# Patient Record
Sex: Female | Born: 1980 | Race: White | Hispanic: No | Marital: Married | Smoking: Never smoker
Health system: Southern US, Community
[De-identification: ages and names within clinical notes are randomized; demographics above are authoritative.]

## PROBLEM LIST (undated history)

## (undated) DIAGNOSIS — G43109 Migraine with aura, not intractable, without status migrainosus: Secondary | ICD-10-CM

## (undated) DIAGNOSIS — J069 Acute upper respiratory infection, unspecified: Secondary | ICD-10-CM

## (undated) DIAGNOSIS — Q513 Bicornate uterus: Secondary | ICD-10-CM

## (undated) DIAGNOSIS — J309 Allergic rhinitis, unspecified: Secondary | ICD-10-CM

## (undated) DIAGNOSIS — J45909 Unspecified asthma, uncomplicated: Secondary | ICD-10-CM

## (undated) HISTORY — PX: DILATION AND CURETTAGE OF UTERUS: SHX78

## (undated) HISTORY — DX: Allergic rhinitis, unspecified: J30.9

## (undated) HISTORY — DX: Migraine with aura, not intractable, without status migrainosus: G43.109

## (undated) HISTORY — DX: Acute upper respiratory infection, unspecified: J06.9

## (undated) HISTORY — DX: Bicornate uterus: Q51.3

## (undated) HISTORY — DX: Unspecified asthma, uncomplicated: J45.909

## (undated) HISTORY — PX: REFRACTIVE SURGERY: SHX103

---

## 2017-09-14 DIAGNOSIS — L03032 Cellulitis of left toe: Secondary | ICD-10-CM | POA: Diagnosis not present

## 2017-10-07 ENCOUNTER — Ambulatory Visit (INDEPENDENT_AMBULATORY_CARE_PROVIDER_SITE_OTHER): Payer: 59 | Admitting: Allergy

## 2017-10-07 ENCOUNTER — Encounter: Payer: Self-pay | Admitting: Allergy

## 2017-10-07 VITALS — BP 110/80 | HR 64 | Temp 98.2°F | Resp 16 | Ht 64.5 in | Wt 150.4 lb

## 2017-10-07 DIAGNOSIS — H1010 Acute atopic conjunctivitis, unspecified eye: Secondary | ICD-10-CM | POA: Diagnosis not present

## 2017-10-07 DIAGNOSIS — J309 Allergic rhinitis, unspecified: Secondary | ICD-10-CM

## 2017-10-07 MED ORDER — AZELASTINE HCL 0.1 % NA SOLN: 2.0000 | Freq: Two times a day (BID) | NASAL | 5 refills | Status: DC

## 2017-10-07 MED ORDER — FLUTICASONE PROPIONATE 50 MCG/ACT NA SUSP: 2.0000 | Freq: Every day | NASAL | 3 refills | Status: DC | PRN

## 2017-10-07 NOTE — Progress Notes (Signed)
New Patient Note  RE: Branae Crail MRN: [id number] DOB: [birth year] Date of Office Visit: 10/07/2017  Referring provider: No ref. provider found Primary care provider: Marda Stalker, PA-C  Chief Complaint: allergies  History of present illness: Leslie Barnes is a 37 y.o. female presenting today for evaluation of allergic rhinitis.    She has moved from [place] to [place] area in June 2019.  While in [place] she was seeing allergist, Dr. Stephanie Coup.  She has a history of allergic rhinitis, chronic sinusitis with a deviated nasal septum.  She had skin testing in January 2019 that showed per the records "strong reactions to grasses, ragweed and dog dander with moderate reactions to trees, cats, feathers and dust mite.  She had environmental allergy testing by serum IgE done in January 2019 as well that showed sensitivity to [place] grass, cockroach, ragweed, dust mites, dog, mtn cedar, mugwort, timothy grass, cat.   She had immunoglobulin levels done that were normal (IgG 792, IgM 148, IgA 234).  She had pneumococcal titers drawn that showed very poor protection with 6 out of 23 strains protected.  She did receive the Pneumovax and have repeat levels done in May 2019 with an appropriate response with now 16 out of 23 strains protected. She denies any infectious history and states that she does not have any history of sinus infections, ear infections, pneumonias, skin infections or any opportunistic infections. With her allergy symptoms she does report sneezing, scratchy throat, sinus pressure in forehead, nasal congestion, nasal drainage with throat clearing and occasional itchy eyes.  Symptoms were worse in spring and summer when she was living in [place].  She has been symptomatic this summer since living here in the [place] area.  She takes either alavert or zyrtec as needed.   She has tried Human resources officer, [facility] as well as dymista in the past.  She states she has used visine allergy eye drops  as needed in the past.  She also has tried singulair which did not seem to make a difference in symptoms control thus this was stopped.  No food allergy history, eczema.   She reports having childhood asthma prior to middle school but has not had any problems since that time.  Review of systems: Review of Systems  Constitutional: Negative for chills, fever and malaise/fatigue.  HENT: Positive for congestion. Negative for ear discharge, ear pain, nosebleeds and sore throat.   Eyes: Negative for pain, discharge and redness.  Respiratory: Negative for cough, shortness of breath and wheezing.   Cardiovascular: Negative for chest pain.  Gastrointestinal: Negative for abdominal pain, constipation, diarrhea, heartburn, nausea and vomiting.  Musculoskeletal: Negative for joint pain.  Skin: Negative for itching and rash.  Neurological: Positive for headaches.    All other systems negative unless noted above in HPI  Past medical history: Past Medical History:  Diagnosis Date  . Allergic rhinitis   . Asthma    Childhood  . Recurrent upper respiratory infection (URI)     Past surgical history: Past Surgical History:  Procedure Laterality Date  . CESAREAN SECTION  2009 and 2012    Family history:  Family History  Problem Relation Age of Onset  . Asthma Mother   . Allergic rhinitis Mother     Social history: She lives in a home with her family without carpeting with gas heating and central cooling.  There is a dog in the home.  There is no concern for water damage, mildew or roaches in the  home.  She has no smoking history.  Medication List: Allergies as of 10/07/2017   No Known Allergies     Medication List        Accurate as of 10/07/17  1:15 PM. Always use your most recent med list.          cetirizine 10 MG tablet Commonly known as:  ZYRTEC Take 10 mg by mouth daily.   omeprazole 20 MG capsule Commonly known as:  PRILOSEC Take 20 mg by mouth daily.       Known  medication allergies: No Known Allergies   Physical examination: Blood pressure 110/80, pulse 64, temperature 98.2 F (36.8 C), resp. rate 16, height 5' 4.5" (1.638 m), weight 150 lb 6.4 oz (68.2 kg), SpO2 99 %.  General: Alert, interactive, in no acute distress. HEENT: PERRLA, TMs pearly gray, turbinates mildly edematous without discharge nasal septum is deviated to the right, post-pharynx non erythematous. Neck: Supple without lymphadenopathy. Lungs: Clear to auscultation without wheezing, rhonchi or rales. {no increased work of breathing. CV: Normal S1, S2 without murmurs. Abdomen: Nondistended, nontender. Skin: Warm and dry, without lesions or rashes. Extremities:  No clubbing, cyanosis or edema. Neuro:   Grossly intact.  Diagnositics/Labs: Labs: See HPI and scanned into EMR   Assessment and plan:   Allergic rhinoconjunctivitis    - previous testing from your allergist in [place] shows sensitivity to grass pollen, tree pollen, weed pollen, cockroach, dust mites, dog and cat.       - provided with allergen avoidance measures for these allergens    - recommend holding off on allergy testing until you have lived through full seasons here.  Thus will plan to perform skin testing around this time next year.      - take long-acting antihistamine daily as needed. Provided with samples of xyzal and allegra to determine if either of these will be more effective than Zyrtec or Alavert    - for nasal congestion recommend use of nasal steroid spray like Flonase, Rhinocort or Nasacort 2 sprays each nostril daily as needed.  Use for 1-2 weeks at a time before stopping for maximal response    - for nasal drainage/post-nasal drip recommend use of nasal antihistamine, Astelin 2 sprays each nostril twice a day as needed.        - for itchy/watery/red eyes recommend use of OTC Alaway or Zaditor  Poor pneumococcal titers     - you have been vaccinated to pneumovax by your previous allergist and had a  robust response!   No further testing is needed at this time.    Follow-up 6-9 months or sooner if needed   I appreciate the opportunity to take part in Ambrielle's care. Please do not hesitate to contact me with questions.  Sincerely,   Prudy Feeler, MD Allergy/Immunology [facility]

## 2017-10-07 NOTE — Patient Instructions (Signed)
Allergic rhinoconjunctivitis    - previous testing from your allergist in [place] shows sensitivity to grass pollen, tree pollen, weed pollen, cockroach, dust mites, dog and cat.       - provided with allergen avoidance measures for these allergens    - recommend holding off on allergy testing until you have lived through full seasons here.  Thus will plan to perform skin testing around this time next year.      - take long-acting antihistamine daily as needed. Provided with samples of xyzal and allegra to determine if either of these will be more effective than Zyrtec or Alavert    - for nasal congestion recommend use of nasal steroid spray like Flonase, Rhinocort or Nasacort 2 sprays each nostril daily as needed.  Use for 1-2 weeks at a time before stopping for maximal response    - for nasal drainage/post-nasal drip recommend use of nasal antihistamine, Astelin 2 sprays each nostril twice a day as needed.        - for itchy/watery/red eyes recommend use of OTC Alaway or Zaditor  Poor pneumococcal titers     - you have been vaccinated to pneumovax by your previous allergist and had a robust response!   No further testing is needed at this time.     Let us know which medications you have at home and which we should send in for you.    Follow-up 6-9 months or sooner if needed

## 2017-11-01 DIAGNOSIS — K219 Gastro-esophageal reflux disease without esophagitis: Secondary | ICD-10-CM | POA: Diagnosis not present

## 2017-11-04 DIAGNOSIS — M25512 Pain in left shoulder: Secondary | ICD-10-CM | POA: Insufficient documentation

## 2017-11-24 DIAGNOSIS — Z23 Encounter for immunization: Secondary | ICD-10-CM | POA: Diagnosis not present

## 2017-12-07 DIAGNOSIS — N926 Irregular menstruation, unspecified: Secondary | ICD-10-CM | POA: Diagnosis not present

## 2017-12-07 DIAGNOSIS — Z6825 Body mass index (BMI) 25.0-25.9, adult: Secondary | ICD-10-CM | POA: Diagnosis not present

## 2017-12-07 DIAGNOSIS — Z01411 Encounter for gynecological examination (general) (routine) with abnormal findings: Secondary | ICD-10-CM | POA: Diagnosis not present

## 2017-12-21 DIAGNOSIS — D225 Melanocytic nevi of trunk: Secondary | ICD-10-CM | POA: Diagnosis not present

## 2017-12-21 DIAGNOSIS — L814 Other melanin hyperpigmentation: Secondary | ICD-10-CM | POA: Diagnosis not present

## 2017-12-21 DIAGNOSIS — D1801 Hemangioma of skin and subcutaneous tissue: Secondary | ICD-10-CM | POA: Diagnosis not present

## 2018-01-10 DIAGNOSIS — K219 Gastro-esophageal reflux disease without esophagitis: Secondary | ICD-10-CM | POA: Diagnosis not present

## 2018-01-10 DIAGNOSIS — Z0000 Encounter for general adult medical examination without abnormal findings: Secondary | ICD-10-CM | POA: Diagnosis not present

## 2018-01-18 DIAGNOSIS — L309 Dermatitis, unspecified: Secondary | ICD-10-CM | POA: Diagnosis not present

## 2018-01-30 DIAGNOSIS — M7502 Adhesive capsulitis of left shoulder: Secondary | ICD-10-CM | POA: Insufficient documentation

## 2018-02-17 DIAGNOSIS — J019 Acute sinusitis, unspecified: Secondary | ICD-10-CM | POA: Diagnosis not present

## 2018-04-11 ENCOUNTER — Encounter: Payer: Self-pay | Admitting: Obstetrics and Gynecology

## 2018-04-12 DIAGNOSIS — N644 Mastodynia: Secondary | ICD-10-CM | POA: Diagnosis not present

## 2018-04-18 ENCOUNTER — Encounter: Payer: Self-pay | Admitting: Obstetrics and Gynecology

## 2018-04-18 DIAGNOSIS — R922 Inconclusive mammogram: Secondary | ICD-10-CM | POA: Diagnosis not present

## 2018-04-18 DIAGNOSIS — N644 Mastodynia: Secondary | ICD-10-CM | POA: Diagnosis not present

## 2018-04-26 ENCOUNTER — Encounter: Payer: 59 | Admitting: Obstetrics and Gynecology

## 2018-04-27 ENCOUNTER — Encounter: Payer: 59 | Admitting: Obstetrics and Gynecology

## 2018-12-12 ENCOUNTER — Other Ambulatory Visit: Payer: Self-pay

## 2018-12-13 ENCOUNTER — Encounter: Payer: Self-pay | Admitting: Obstetrics and Gynecology

## 2018-12-13 ENCOUNTER — Other Ambulatory Visit (HOSPITAL_COMMUNITY)
Admission: RE | Admit: 2018-12-13 | Discharge: 2018-12-13 | Disposition: A | Discharge status: Home / Self Care | Payer: 59 | Source: Ambulatory Visit | Attending: Obstetrics and Gynecology | Admitting: Obstetrics and Gynecology

## 2018-12-13 ENCOUNTER — Ambulatory Visit (INDEPENDENT_AMBULATORY_CARE_PROVIDER_SITE_OTHER): Payer: 59 | Admitting: Obstetrics and Gynecology

## 2018-12-13 VITALS — BP 116/80 | HR 64 | Temp 97.3°F | Ht 65.0 in | Wt 163.8 lb

## 2018-12-13 DIAGNOSIS — N926 Irregular menstruation, unspecified: Secondary | ICD-10-CM

## 2018-12-13 DIAGNOSIS — Z0000 Encounter for general adult medical examination without abnormal findings: Secondary | ICD-10-CM | POA: Diagnosis not present

## 2018-12-13 DIAGNOSIS — Z01419 Encounter for gynecological examination (general) (routine) without abnormal findings: Secondary | ICD-10-CM

## 2018-12-13 DIAGNOSIS — Z124 Encounter for screening for malignant neoplasm of cervix: Secondary | ICD-10-CM

## 2018-12-13 DIAGNOSIS — N946 Dysmenorrhea, unspecified: Secondary | ICD-10-CM

## 2018-12-13 DIAGNOSIS — Z833 Family history of diabetes mellitus: Secondary | ICD-10-CM | POA: Diagnosis not present

## 2018-12-13 MED ORDER — NAPROXEN SODIUM 550 MG PO TABS: 550.0000 mg | ORAL_TABLET | Freq: Two times a day (BID) | ORAL | 1 refills | Status: DC

## 2018-12-13 NOTE — Patient Instructions (Signed)

## 2018-12-13 NOTE — Progress Notes (Signed)
38 y.o. [id number] Married White or Caucasian Not Hispanic or Latino female here for annual exam.   Husband with vasectomy, no dyspareunia.   Cycles have been irregular since she had her daughter 8 years ago. Range from q 27-35 days. Heaviness and cramps vary.  Period Duration (Days): 3-6 days Period Pattern: (!) Irregular Menstrual Flow: Light, Heavy Menstrual Control: Tampon, Thin pad Menstrual Control Change Freq (Hours): changes tampon/pad every 2 hours on heavy days, changes tampon/pad every 4-6 hours Dysmenorrhea: (!) Severe Dysmenorrhea Symptoms: Cramping  Patient's last menstrual period was 11/10/2018 (exact date).          Sexually active: Yes.    The current method of family planning is vasectomy.    Exercising: Yes.    riding bikes, walking Smoker:  no  Health Maintenance: Pap: Unsure  History of abnormal Pap:  no MMG:  At [facility] 05/2018 WNL BMD:   N/A Colonoscopy: N/A TDaP:  UTD per patient. 10/24/2012 Gardasil: completed all 3   reports that she has never smoked. She has quit using smokeless tobacco. She reports current alcohol use. She reports that she does not use drugs. Up to 5 drinks a week. Moved here from [place]. Self employed, insurance and billing, working from home. 35 year old daughter and 51 year old son.    Past Medical History:  Diagnosis Date  . Allergic rhinitis   . Asthma    Childhood  . Bicornate uterus   . Recurrent upper respiratory infection (URI)     Past Surgical History:  Procedure Laterality Date  . CESAREAN SECTION  2009 and 2012  . DILATION AND CURETTAGE OF UTERUS    . REFRACTIVE SURGERY    Hysteroscopy in 2018 for AUB in the office. Normal.  Current Outpatient Medications  Medication Sig Dispense Refill  . azelastine (ASTELIN) 0.1 % nasal spray Place 2 sprays into both nostrils 2 (two) times daily. 30 mL 5  . cetirizine (ZYRTEC) 10 MG tablet Take 10 mg by mouth daily.    . fluticasone (FLONASE) 50 MCG/ACT nasal spray Place 2 sprays  into both nostrils daily as needed for allergies or rhinitis. 16 g 3  . Multiple Vitamin (MULTIVITAMIN PO) Take by mouth.    Marland Kitchen omeprazole (PRILOSEC) 20 MG capsule Take 20 mg by mouth daily.     No current facility-administered medications for this visit.     Family History  Problem Relation Age of Onset  . Asthma Mother   . Allergic rhinitis Mother   . Non-Hodgkin's lymphoma Mother   . Diabetes Father   . Heart failure Maternal Grandmother   . COPD Maternal Grandmother   . Hypertension Maternal Grandmother     Review of Systems  Constitutional: Negative.   HENT: Negative.   Eyes: Negative.   Respiratory: Negative.   Cardiovascular: Negative.   Gastrointestinal: Negative.   Endocrine: Negative.   Genitourinary: Positive for menstrual problem.  Musculoskeletal: Negative.   Skin: Negative.   Allergic/Immunologic: Negative.   Neurological: Negative.   Hematological: Negative.   Psychiatric/Behavioral: Negative.     Exam:   BP 116/80 (BP Location: Right Arm, Patient Position: Sitting, Cuff Size: Normal)   Pulse 64   Temp (!) 97.3 F (36.3 C) (Skin)   Ht 5\' 5"  (1.651 m)   Wt 163 lb 12.8 oz (74.3 kg)   LMP 11/10/2018 (Exact Date)   BMI 27.26 kg/m   Weight change: @WEIGHTCHANGE @ Height:   Height: 5\' 5"  (165.1 cm)  Ht Readings from Last 3 Encounters:  12/13/18 5\' 5"  (1.651 m)  10/07/17 5' 4.5" (1.638 m)    General appearance: alert, cooperative and appears stated age Head: Normocephalic, without obvious abnormality, atraumatic Neck: no adenopathy, supple, symmetrical, trachea midline and thyroid normal to inspection and palpation Lungs: clear to auscultation bilaterally Cardiovascular: regular rate and rhythm Breasts: normal appearance, no masses or tenderness Abdomen: soft, non-tender; non distended,  no masses,  no organomegaly Extremities: extremities normal, atraumatic, no cyanosis or edema Skin: Skin color, texture, turgor normal. No rashes or lesions Lymph  nodes: Cervical, supraclavicular, and axillary nodes normal. No abnormal inguinal nodes palpated Neurologic: Grossly normal   Pelvic: External genitalia:  no lesions              Urethra:  normal appearing urethra with no masses, tenderness or lesions              Bartholins and Skenes: normal                 Vagina: normal appearing vagina with normal color and discharge, no lesions              Cervix: no lesions               Bimanual Exam:  Uterus:  normal size, contour, position, consistency, mobility, non-tender and retroverted              Adnexa: no mass, fullness, tenderness               Rectovaginal: Confirms               Anus:  normal sphincter tone, no lesions  Chaperone was present for exam.  A:  Well Woman with normal exam  FH of DM  Irregular cycles  Dysmenorrhea  P:   Pap with hpv  Screening labs, HgbA1C, TSH  Discussed breast self exam  Discussed calcium and vit D intake  Mammogram due next year  Anaprox for cramps

## 2018-12-14 LAB — TSH: TSH: 2.11 u[IU]/mL (ref 0.450–4.500)

## 2018-12-14 LAB — COMPREHENSIVE METABOLIC PANEL
ALT: 19 IU/L (ref 0–32)
AST: 18 IU/L (ref 0–40)
Albumin/Globulin Ratio: 1.8 (ref 1.2–2.2)
Albumin: 4.5 g/dL (ref 3.8–4.8)
Alkaline Phosphatase: 53 IU/L (ref 39–117)
BUN/Creatinine Ratio: 19 (ref 9–23)
BUN: 15 mg/dL (ref 6–20)
Bilirubin Total: 0.6 mg/dL (ref 0.0–1.2)
CO2: 23 mmol/L (ref 20–29)
Calcium: 9.8 mg/dL (ref 8.7–10.2)
Chloride: 100 mmol/L (ref 96–106)
Creatinine, Ser: 0.79 mg/dL (ref 0.57–1.00)
GFR calc Af Amer: 109 mL/min/{1.73_m2} (ref >59)
GFR calc non Af Amer: 95 mL/min/{1.73_m2} (ref >59)
Globulin, Total: 2.5 g/dL (ref 1.5–4.5)
Glucose: 93 mg/dL (ref 65–99)
Potassium: 4.2 mmol/L (ref 3.5–5.2)
Sodium: 139 mmol/L (ref 134–144)
Total Protein: 7 g/dL (ref 6.0–8.5)

## 2018-12-14 LAB — LIPID PANEL
Chol/HDL Ratio: 3 ratio (ref 0.0–4.4)
Cholesterol, Total: 170 mg/dL (ref 100–199)
HDL: 57 mg/dL (ref >39)
LDL Chol Calc (NIH): 93 mg/dL (ref 0–99)
Triglycerides: 110 mg/dL (ref 0–149)
VLDL Cholesterol Cal: 20 mg/dL (ref 5–40)

## 2018-12-14 LAB — HEMOGLOBIN A1C
Est. average glucose Bld gHb Est-mCnc: 108 mg/dL
Hgb A1c MFr Bld: 5.4 % (ref 4.8–5.6)

## 2018-12-14 LAB — CBC
Hematocrit: 41.8 % (ref 34.0–46.6)
Hemoglobin: 14 g/dL (ref 11.1–15.9)
MCH: 29.3 pg (ref 26.6–33.0)
MCHC: 33.5 g/dL (ref 31.5–35.7)
MCV: 87 fL (ref 79–97)
Platelets: 184 10*3/uL (ref 150–450)
RBC: 4.78 x10E6/uL (ref 3.77–5.28)
RDW: 12.3 % (ref 11.7–15.4)
WBC: 5.9 10*3/uL (ref 3.4–10.8)

## 2018-12-20 LAB — CYTOLOGY - PAP
Comment: NEGATIVE
Diagnosis: NEGATIVE
High risk HPV: NEGATIVE

## 2018-12-28 ENCOUNTER — Encounter: Payer: Self-pay | Admitting: Obstetrics and Gynecology

## 2018-12-28 ENCOUNTER — Telehealth: Payer: Self-pay | Admitting: Obstetrics and Gynecology

## 2018-12-28 MED ORDER — MEDROXYPROGESTERONE ACETATE 5 MG PO TABS: 5.0000 mg | ORAL_TABLET | Freq: Every day | ORAL | 0 refills | Status: DC

## 2018-12-28 NOTE — Telephone Encounter (Signed)
You can call in provera 5 mg x 5 days, if her cycles get further out we should have her come in for a prolactin level. She just had a normal TSH.

## 2018-12-28 NOTE — Telephone Encounter (Signed)
Spoke with patient, advised per Dr. Talbert Nan. Rx for Provera to verified pharmacy. Patient verbalizes understanding and is agreeable.   Encounter closed.

## 2018-12-28 NOTE — Telephone Encounter (Signed)
Patient sent the following message through [place]. Routing to triage to assist patient with request.  Saiya, [facility]  Phone Number: [phone]        I haven't started my cycle, I feel like I am going to start and I am very uncomfortable. Is there anything you can prescribe to help this happen?  Thank you  Leslie Barnes

## 2018-12-28 NOTE — Telephone Encounter (Signed)
Spoke with patient. Patient was seen in office on 12/13/18 for AEX. LMP 11/10/18. Hx of irregular cycles.  Vasectomy for contraceptive. Menses has not started to date. Reports intermittent menses cramps and bloating like menses is going to start and never does. Denies N/V, fever/chills, vaginal odor, d/c, or pain today. Normal BMs.   Patient is requesting Rx to help menses start. Advised patient I will have to review with Dr. Talbert Nan and return call, patient agreeable. Pharmacy confirmed.   Dr. Talbert Nan -please advise.

## 2019-01-30 ENCOUNTER — Encounter: Payer: Self-pay | Admitting: Obstetrics and Gynecology

## 2019-02-23 ENCOUNTER — Other Ambulatory Visit: Payer: 59

## 2019-12-17 NOTE — Progress Notes (Signed)
39 y.o. [id number] Married White or Caucasian Not Hispanic or Latino female here for annual exam.   Cycles every 27-35 days.  Period Duration (Days): 5-7 Period Pattern: (!) Irregular Menstrual Flow: Heavy, Moderate, Light Menstrual Control: Maxi pad, Tampon Menstrual Control Change Freq (Hours): 2-3 Dysmenorrhea: (!) Moderate Dysmenorrhea Symptoms: Cramping  No dyspareunia.   Patient's last menstrual period was 11/23/2019.          Sexually active: Yes.    The current method of family planning is vasectomy  Exercising: Yes.    walking, and running, yoga Smoker:  no  Health Maintenance: Pap:12/13/18 WNL Hr HPV Neg  11/11/16 WNL  History of abnormal Pap:  no MMG:  U/s  04/18/2018  Bi-rads normal [facility].  BMD:   None  Colonoscopy: none  TDaP:  utd per patient 10/24/2012 Gardasil: complete    reports that she has never smoked. She has quit using smokeless tobacco. She reports current alcohol use. She reports that she does not use drugs. 4 drinks a week. 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    Past Medical History:  Diagnosis Date  . Allergic rhinitis   . Asthma    Childhood  . Bicornate uterus   . Recurrent upper respiratory infection (URI)     Past Surgical History:  Procedure Laterality Date  . CESAREAN SECTION  2009 and 2012  . DILATION AND CURETTAGE OF UTERUS    . REFRACTIVE SURGERY      Current Outpatient Medications  Medication Sig Dispense Refill  . cetirizine (ZYRTEC) 10 MG tablet Take 10 mg by mouth daily.    . Multiple Vitamin (MULTIVITAMIN PO) Take by mouth.    Marland Kitchen omeprazole (PRILOSEC) 20 MG capsule Take 20 mg by mouth daily.     No current facility-administered medications for this visit.    Family History  Problem Relation Age of Onset  . Asthma Mother   . Allergic rhinitis Mother   . Non-Hodgkin's lymphoma Mother   . Diabetes Father   . Heart failure Maternal Grandmother   . COPD Maternal  Grandmother   . Hypertension Maternal Grandmother     Review of Systems  Allergic/Immunologic: Positive for environmental allergies.  All other systems reviewed and are negative.   Exam:   BP 110/68   Pulse 98   Ht 5' 4.5" (1.638 m)   Wt 164 lb (74.4 kg)   LMP 11/23/2019   SpO2 100%   BMI 27.72 kg/m   Weight change: @WEIGHTCHANGE @ Height:   Height: 5' 4.5" (163.8 cm)  Ht Readings from Last 3 Encounters:  12/19/19 5' 4.5" (1.638 m)  12/13/18 5\' 5"  (1.651 m)  10/07/17 5' 4.5" (1.638 m)    General appearance: alert, cooperative and appears stated age Head: Normocephalic, without obvious abnormality, atraumatic Neck: no adenopathy, supple, symmetrical, trachea midline and thyroid normal to inspection and palpation Lungs: clear to auscultation bilaterally Cardiovascular: regular rate and rhythm Breasts: normal appearance, no masses or tenderness Abdomen: soft, non-tender; non distended,  no masses,  no organomegaly Extremities: extremities normal, atraumatic, no cyanosis or edema Skin: Skin color, texture, turgor normal. No rashes or lesions Lymph nodes: Cervical, supraclavicular, and axillary nodes normal. No abnormal inguinal nodes palpated Neurologic: Grossly normal   Pelvic: External genitalia:  no lesions              Urethra:  normal appearing urethra with no masses, tenderness or lesions  Bartholins and Skenes: normal                 Vagina: normal appearing vagina with normal color and discharge, no lesions              Cervix: no lesions               Bimanual Exam:  Uterus:  normal size, contour, position, consistency, mobility, non-tender and retroverted              Adnexa: no mass, fullness, tenderness               Rectovaginal: declines  Shanon Petty chaperoned for the exam.  A:  Well Woman with normal exam  FH DM  P:   No pap this year  Screening labs, HgbA1C  She will schedule her mammogram  Discussed breast self exam  Discussed calcium  and vit D intake

## 2019-12-19 ENCOUNTER — Other Ambulatory Visit: Payer: Self-pay

## 2019-12-19 ENCOUNTER — Ambulatory Visit (INDEPENDENT_AMBULATORY_CARE_PROVIDER_SITE_OTHER): Payer: BC Managed Care – PPO | Admitting: Obstetrics and Gynecology

## 2019-12-19 ENCOUNTER — Encounter: Payer: Self-pay | Admitting: Obstetrics and Gynecology

## 2019-12-19 VITALS — BP 110/68 | HR 98 | Ht 64.5 in | Wt 164.0 lb

## 2019-12-19 DIAGNOSIS — Z0000 Encounter for general adult medical examination without abnormal findings: Secondary | ICD-10-CM | POA: Diagnosis not present

## 2019-12-19 DIAGNOSIS — Z833 Family history of diabetes mellitus: Secondary | ICD-10-CM | POA: Diagnosis not present

## 2019-12-19 DIAGNOSIS — Z01419 Encounter for gynecological examination (general) (routine) without abnormal findings: Secondary | ICD-10-CM | POA: Diagnosis not present

## 2019-12-19 NOTE — Patient Instructions (Signed)

## 2019-12-20 LAB — COMPREHENSIVE METABOLIC PANEL
ALT: 15 IU/L (ref 0–32)
AST: 16 IU/L (ref 0–40)
Albumin/Globulin Ratio: 1.6 (ref 1.2–2.2)
Albumin: 4.1 g/dL (ref 3.8–4.8)
Alkaline Phosphatase: 48 IU/L (ref 44–121)
BUN/Creatinine Ratio: 15 (ref 9–23)
BUN: 13 mg/dL (ref 6–24)
Bilirubin Total: 0.5 mg/dL (ref 0.0–1.2)
CO2: 23 mmol/L (ref 20–29)
Calcium: 8.8 mg/dL (ref 8.7–10.2)
Chloride: 103 mmol/L (ref 96–106)
Creatinine, Ser: 0.85 mg/dL (ref 0.57–1.00)
GFR calc Af Amer: 99 mL/min/{1.73_m2} (ref >59)
GFR calc non Af Amer: 86 mL/min/{1.73_m2} (ref >59)
Globulin, Total: 2.5 g/dL (ref 1.5–4.5)
Glucose: 85 mg/dL (ref 65–99)
Potassium: 4.1 mmol/L (ref 3.5–5.2)
Sodium: 140 mmol/L (ref 134–144)
Total Protein: 6.6 g/dL (ref 6.0–8.5)

## 2019-12-20 LAB — HEMOGLOBIN A1C
Est. average glucose Bld gHb Est-mCnc: 114 mg/dL
Hgb A1c MFr Bld: 5.6 % (ref 4.8–5.6)

## 2019-12-20 LAB — CBC
Hematocrit: 41.1 % (ref 34.0–46.6)
Hemoglobin: 13.2 g/dL (ref 11.1–15.9)
MCH: 28.8 pg (ref 26.6–33.0)
MCHC: 32.1 g/dL (ref 31.5–35.7)
MCV: 90 fL (ref 79–97)
Platelets: 171 10*3/uL (ref 150–450)
RBC: 4.58 x10E6/uL (ref 3.77–5.28)
RDW: 12.7 % (ref 11.7–15.4)
WBC: 8.1 10*3/uL (ref 3.4–10.8)

## 2019-12-20 LAB — LIPID PANEL
Chol/HDL Ratio: 3.2 ratio (ref 0.0–4.4)
Cholesterol, Total: 163 mg/dL (ref 100–199)
HDL: 51 mg/dL (ref >39)
LDL Chol Calc (NIH): 86 mg/dL (ref 0–99)
Triglycerides: 149 mg/dL (ref 0–149)
VLDL Cholesterol Cal: 26 mg/dL (ref 5–40)

## 2019-12-24 DIAGNOSIS — D1801 Hemangioma of skin and subcutaneous tissue: Secondary | ICD-10-CM | POA: Diagnosis not present

## 2019-12-24 DIAGNOSIS — D225 Melanocytic nevi of trunk: Secondary | ICD-10-CM | POA: Diagnosis not present

## 2019-12-24 DIAGNOSIS — L814 Other melanin hyperpigmentation: Secondary | ICD-10-CM | POA: Diagnosis not present

## 2020-01-16 ENCOUNTER — Encounter: Payer: Self-pay | Admitting: Obstetrics and Gynecology

## 2020-01-16 DIAGNOSIS — Z1231 Encounter for screening mammogram for malignant neoplasm of breast: Secondary | ICD-10-CM | POA: Diagnosis not present

## 2020-02-18 DIAGNOSIS — J0100 Acute maxillary sinusitis, unspecified: Secondary | ICD-10-CM | POA: Diagnosis not present

## 2020-02-19 ENCOUNTER — Encounter: Payer: Self-pay | Admitting: Obstetrics and Gynecology

## 2020-05-15 ENCOUNTER — Other Ambulatory Visit: Payer: Self-pay | Admitting: Obstetrics and Gynecology

## 2020-05-15 DIAGNOSIS — Z1231 Encounter for screening mammogram for malignant neoplasm of breast: Secondary | ICD-10-CM

## 2020-05-23 ENCOUNTER — Ambulatory Visit
Admission: RE | Admit: 2020-05-23 | Discharge: 2020-05-23 | Disposition: A | Discharge status: Home / Self Care | Payer: BC Managed Care – PPO | Source: Ambulatory Visit | Attending: Obstetrics and Gynecology | Admitting: Obstetrics and Gynecology

## 2020-05-23 ENCOUNTER — Other Ambulatory Visit: Payer: Self-pay

## 2020-05-23 DIAGNOSIS — Z1231 Encounter for screening mammogram for malignant neoplasm of breast: Secondary | ICD-10-CM

## 2020-05-24 ENCOUNTER — Inpatient Hospital Stay: Admission: RE | Admit: 2020-05-24 | Payer: Self-pay | Source: Ambulatory Visit

## 2020-05-24 DIAGNOSIS — Z1231 Encounter for screening mammogram for malignant neoplasm of breast: Secondary | ICD-10-CM

## 2020-06-25 DIAGNOSIS — Z0000 Encounter for general adult medical examination without abnormal findings: Secondary | ICD-10-CM | POA: Diagnosis not present

## 2020-07-18 ENCOUNTER — Telehealth: Payer: Self-pay | Admitting: *Deleted

## 2020-07-18 ENCOUNTER — Encounter: Payer: Self-pay | Admitting: Obstetrics and Gynecology

## 2020-07-18 NOTE — Telephone Encounter (Signed)
-----   Message from Leamon Arnt, [place] sent at 07/18/2020  2:39 PM EDT ----- I called this patient to schedule. Patient declined an actual office visit stating "I have discussed this with Dr. Talbert Nan before and have had my labs done already".  I offered several dates and times, patient still declined and stated "Just forget about it I will go to my PCP". Patient has not been seen with Dr.Jertson or the office since Nov 2021

## 2020-07-18 NOTE — Telephone Encounter (Signed)
Leslie Barnes called to schedule annual exam, the below is placed in chart for documentation.

## 2020-10-01 ENCOUNTER — Encounter: Payer: Self-pay | Admitting: Obstetrics and Gynecology

## 2020-10-01 ENCOUNTER — Ambulatory Visit (INDEPENDENT_AMBULATORY_CARE_PROVIDER_SITE_OTHER): Payer: BC Managed Care – PPO | Admitting: Obstetrics and Gynecology

## 2020-10-01 ENCOUNTER — Other Ambulatory Visit: Payer: Self-pay

## 2020-10-01 VITALS — BP 122/74 | HR 83 | Ht 64.0 in | Wt 170.0 lb

## 2020-10-01 DIAGNOSIS — Z872 Personal history of diseases of the skin and subcutaneous tissue: Secondary | ICD-10-CM | POA: Diagnosis not present

## 2020-10-01 DIAGNOSIS — Z0000 Encounter for general adult medical examination without abnormal findings: Secondary | ICD-10-CM

## 2020-10-01 NOTE — Progress Notes (Signed)
GYNECOLOGY  VISIT   HPI: 40 y.o.   Married White or Caucasian Not Hispanic or Latino  female   [phone] with Patient's last menstrual period was 09/21/2020.   here for an itchy rash on the side of  right breast and right axilla.  A couple of weeks ago she was using her deodorant and had some erythema in her axilla and lateral right breast. Since she stopped using her deodorant she is better. She was sweating a lot. She tried the deodorant again and got red again. She stopped using it and is better again. Last mammogram was in 4/22 and was normal.   GYNECOLOGIC HISTORY: Patient's last menstrual period was 09/21/2020. 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     Multiple Vitamin (MULTIVITAMIN PO) Take by mouth.     omeprazole (PRILOSEC) 20 MG capsule Take 20 mg by mouth daily.     No current facility-administered medications for this visit.     ALLERGIES: Latex  Family History  Problem Relation Age of Onset   Asthma Mother    Allergic rhinitis Mother    Non-Hodgkin's lymphoma Mother    Diabetes Father    Heart failure Maternal Grandmother    COPD Maternal Grandmother    Hypertension Maternal Grandmother      Social History   Socioeconomic History   Marital status: Married    Spouse name: Not on file   Number of children: Not on file   Years of education: Not on file   Highest education level: Not on file  Occupational History   Not on file  Tobacco Use   Smoking status: Never   Smokeless tobacco: Former  Scientific laboratory technician Use: Never used  Substance and Sexual Activity   Alcohol use: Yes    Comment: socially   Drug use: Never   Sexual activity: Yes    Comment: spouse with vasectomy  Other Topics Concern   Not on file  Social History Narrative   Not on file   Social Determinants of Health   Financial Resource Strain: Not on file  Food Insecurity: Not on file  Transportation Needs: Not on file  Physical Activity: Not on file  Stress: Not on file  Social Connections: Not on file  Intimate Partner Violence: Not on file    Review of Systems  All  other systems reviewed and are negative.  PHYSICAL EXAMINATION:    BP 122/74   Pulse 83   Ht '5\' 4"'$  (1.626 m)   Wt 170 lb (77.1 kg)   LMP 09/21/2020   SpO2 100%   BMI 29.18 kg/m     General appearance: alert, cooperative and appears stated age Breasts: normal appearance, no masses or tenderness Axilla: no adenopathy  1. Normal breast exam   2. History of dermatitis She will change to a hypoallergenic deodorant.

## 2020-12-23 DIAGNOSIS — L814 Other melanin hyperpigmentation: Secondary | ICD-10-CM | POA: Diagnosis not present

## 2020-12-23 DIAGNOSIS — D225 Melanocytic nevi of trunk: Secondary | ICD-10-CM | POA: Diagnosis not present

## 2020-12-23 DIAGNOSIS — L249 Irritant contact dermatitis, unspecified cause: Secondary | ICD-10-CM | POA: Diagnosis not present

## 2020-12-24 NOTE — Progress Notes (Signed)
40 y.o. [id number] Married White or Caucasian Not Hispanic or Latino female here for annual exam.  Cycles range from 28-35 days typically. She has noticed mood changes or increased anxiety for few days to a week prior to her cycle. Gets better with her cycle. She doesn't want to go on medication.  Period Duration (Days): 4-5 Period Pattern: (!) Irregular Menstrual Flow: Heavy Menstrual Control: Maxi pad Menstrual Control Change Freq (Hours): 1 Dysmenorrhea: (!) Moderate Dysmenorrhea Symptoms: Cramping This cycle she was bleeding heavier than normal, used a thin pad hourly, feels she could have worn a regular tampon for 2 hours. Typically would saturate a regular tampon in 4 hours.  Sexually active, no pain.   Patient's last menstrual period was 12/19/2020.          Sexually active: Yes.    The current method of family planning is vasectomy.    Exercising: Yes.     Walking  Smoker:  no  Health Maintenance: Pap:  12/13/18-WNL, HPV- neg, 11/11/16- WNL History of abnormal Pap:  no MMG:  05/23/20- birads 1 neg  BMD:   n/a Colonoscopy: n/a TDaP:  10/24/2012 Gardasil: complete   reports that she has never smoked. She has quit using smokeless tobacco. She reports current alcohol use. She reports that she does not use drugs. 3 drinks a week at most. Self employed, insurance and billing, working from home. 78 year old daughter and 42 year old son.    Past Medical History:  Diagnosis Date   Allergic rhinitis    Asthma    Childhood   Bicornate uterus    Recurrent upper respiratory infection (URI)     Past Surgical History:  Procedure Laterality Date   CESAREAN SECTION  2009 and 2012   DILATION AND CURETTAGE OF UTERUS     REFRACTIVE SURGERY      Current Outpatient Medications  Medication Sig Dispense Refill   cetirizine (ZYRTEC) 10 MG tablet Take 10 mg by mouth daily.     Multiple Vitamin (MULTIVITAMIN PO) Take by mouth.     omeprazole (PRILOSEC) 20 MG capsule Take 20 mg by mouth daily.      No current facility-administered medications for this visit.    Family History  Problem Relation Age of Onset   Asthma Mother    Allergic rhinitis Mother    Non-Hodgkin's lymphoma Mother    Diabetes Father    Heart failure Maternal Grandmother    COPD Maternal Grandmother    Hypertension Maternal Grandmother   Mom in remission, doing great.   Review of Systems  All other systems reviewed and are negative.  Exam:   BP 120/72   Pulse 78   Ht 5\' 4"  (1.626 m)   Wt 172 lb (78 kg)   LMP 12/19/2020   SpO2 100%   BMI 29.52 kg/m   Weight change: @WEIGHTCHANGE @ Height:   Height: 5\' 4"  (162.6 cm)  Ht Readings from Last 3 Encounters:  12/31/20 5\' 4"  (1.626 m)  10/01/20 5\' 4"  (1.626 m)  12/19/19 5' 4.5" (1.638 m)    General appearance: alert, cooperative and appears stated age Head: Normocephalic, without obvious abnormality, atraumatic Neck: no adenopathy, supple, symmetrical, trachea midline and thyroid normal to inspection and palpation Lungs: clear to auscultation bilaterally Cardiovascular: regular rate and rhythm Breasts: normal appearance, no masses or tenderness Abdomen: soft, non-tender; non distended,  no masses,  no organomegaly Extremities: extremities normal, atraumatic, no cyanosis or edema Skin: Skin color, texture, turgor normal. No rashes or lesions  Lymph nodes: Cervical, supraclavicular, and axillary nodes normal. No abnormal inguinal nodes palpated Neurologic: Grossly normal   Pelvic: External genitalia:  no lesions              Urethra:  normal appearing urethra with no masses, tenderness or lesions              Bartholins and Skenes: normal                 Vagina: normal appearing vagina with normal color and discharge, no lesions              Cervix: no lesions               Bimanual Exam:  Uterus:  normal size, contour, position, consistency, mobility, non-tender and retroverted              Adnexa: no mass, fullness, tenderness                Rectovaginal: declines  Gae Dry chaperoned for the exam.  1. Well woman exam Discussed breast self exam Discussed calcium and vit D intake Mammogram UTD No pap this year  2. Laboratory exam ordered as part of routine general medical examination - CBC - Comprehensive metabolic panel - Lipid panel  3. Family history of diabetes mellitus - Hemoglobin A1c  4. PMS (premenstrual syndrome) Discussed eating healthy, exercise. Discussed the option SSRI's, she can't take OCP's. She will let me know if she wants to try SSRI's, reviewed the option of taking it with symptoms vs daily

## 2020-12-31 ENCOUNTER — Encounter: Payer: Self-pay | Admitting: Obstetrics and Gynecology

## 2020-12-31 ENCOUNTER — Other Ambulatory Visit: Payer: Self-pay

## 2020-12-31 ENCOUNTER — Ambulatory Visit (INDEPENDENT_AMBULATORY_CARE_PROVIDER_SITE_OTHER): Payer: BC Managed Care – PPO | Admitting: Obstetrics and Gynecology

## 2020-12-31 VITALS — BP 120/72 | HR 78 | Ht 64.0 in | Wt 172.0 lb

## 2020-12-31 DIAGNOSIS — G43109 Migraine with aura, not intractable, without status migrainosus: Secondary | ICD-10-CM

## 2020-12-31 DIAGNOSIS — Z833 Family history of diabetes mellitus: Secondary | ICD-10-CM

## 2020-12-31 DIAGNOSIS — Z0000 Encounter for general adult medical examination without abnormal findings: Secondary | ICD-10-CM | POA: Diagnosis not present

## 2020-12-31 DIAGNOSIS — N943 Premenstrual tension syndrome: Secondary | ICD-10-CM | POA: Diagnosis not present

## 2020-12-31 DIAGNOSIS — Z01419 Encounter for gynecological examination (general) (routine) without abnormal findings: Secondary | ICD-10-CM | POA: Diagnosis not present

## 2020-12-31 NOTE — Patient Instructions (Addendum)
EXERCISE   We recommended that you start or continue a regular exercise program for good health. Physical activity is anything that gets your body moving, some is better than none. The CDC recommends 150 minutes per week of Moderate-Intensity Aerobic Activity and 2 or more days of Muscle Strengthening Activity.  Benefits of exercise are limitless: helps weight loss/weight maintenance, improves mood and energy, helps with depression and anxiety, improves sleep, tones and strengthens muscles, improves balance, improves bone density, protects from chronic conditions such as heart disease, high blood pressure and diabetes and so much more. To learn more visit: https://www.cdc.gov/physicalactivity/index.html  DIET: Good nutrition starts with a healthy diet of fruits, vegetables, whole grains, and lean protein sources. Drink plenty of water for hydration. Minimize empty calories, sodium, sweets. For more information about dietary recommendations visit: https://health.gov/our-work/nutrition-physical-activity/dietary-guidelines and https://www.myplate.gov/  ALCOHOL:  Women should limit their alcohol intake to no more than 7 drinks/beers/glasses of wine (combined, not each!) per week. Moderation of alcohol intake to this level decreases your risk of breast cancer and liver damage.  If you are concerned that you may have a problem, or your friends have told you they are concerned about your drinking, there are many resources to help. A well-known program that is free, effective, and available to all people all over the nation is Alcoholics Anonymous.  Check out this site to learn more: https://www.aa.org/   CALCIUM AND VITAMIN D:  Adequate intake of calcium and Vitamin D are recommended for bone health.  You should be getting between 1000-1200 mg of calcium and 800 units of Vitamin D daily between diet and supplements  PAP SMEARS:  Pap smears, to check for cervical cancer or precancers,  have traditionally been  done yearly, scientific advances have shown that most women can have pap smears less often.  However, every woman still should have a physical exam from her gynecologist every year. It will include a breast check, inspection of the vulva and vagina to check for abnormal growths or skin changes, a visual exam of the cervix, and then an exam to evaluate the size and shape of the uterus and ovaries. We will also provide age appropriate advice regarding health maintenance, like when you should have certain vaccines, screening for sexually transmitted diseases, bone density testing, colonoscopy, mammograms, etc.   MAMMOGRAMS:  All women over 40 years old should have a routine mammogram.   COLON CANCER SCREENING: Now recommend starting at age 45. At this time colonoscopy is not covered for routine screening until 50. There are take home tests that can be done between 45-49.   COLONOSCOPY:  Colonoscopy to screen for colon cancer is recommended for all women at age 50.  We know, you hate the idea of the prep.  We agree, BUT, having colon cancer and not knowing it is worse!!  Colon cancer so often starts as a polyp that can be seen and removed at colonscopy, which can quite literally save your life!  And if your first colonoscopy is normal and you have no family history of colon cancer, most women don't have to have it again for 10 years.  Once every ten years, you can do something that may end up saving your life, right?  We will be happy to help you get it scheduled when you are ready.  Be sure to check your insurance coverage so you understand how much it will cost.  It may be covered as a preventative service at no cost, but you should check   your particular policy.      Breast Self-Awareness Breast self-awareness means being familiar with how your breasts look and feel. It involves checking your breasts regularly and reporting any changes to your health care provider. Practicing breast self-awareness is  important. A change in your breasts can be a sign of a serious medical problem. Being familiar with how your breasts look and feel allows you to find any problems early, when treatment is more likely to be successful. All women should practice breast self-awareness, including women who have had breast implants. How to do a breast self-exam One way to learn what is normal for your breasts and whether your breasts are changing is to do a breast self-exam. To do a breast self-exam: Look for Changes  Remove all the clothing above your waist. Stand in front of a mirror in a room with good lighting. Put your hands on your hips. Push your hands firmly downward. Compare your breasts in the mirror. Look for differences between them (asymmetry), such as: Differences in shape. Differences in size. Puckers, dips, and bumps in one breast and not the other. Look at each breast for changes in your skin, such as: Redness. Scaly areas. Look for changes in your nipples, such as: Discharge. Bleeding. Dimpling. Redness. A change in position. Feel for Changes Carefully feel your breasts for lumps and changes. It is best to do this while lying on your back on the floor and again while sitting or standing in the shower or tub with soapy water on your skin. Feel each breast in the following way: Place the arm on the side of the breast you are examining above your head. Feel your breast with the other hand. Start in the nipple area and make  inch (2 cm) overlapping circles to feel your breast. Use the pads of your three middle fingers to do this. Apply light pressure, then medium pressure, then firm pressure. The light pressure will allow you to feel the tissue closest to the skin. The medium pressure will allow you to feel the tissue that is a little deeper. The firm pressure will allow you to feel the tissue close to the ribs. Continue the overlapping circles, moving downward over the breast until you feel your  ribs below your breast. Move one finger-width toward the center of the body. Continue to use the  inch (2 cm) overlapping circles to feel your breast as you move slowly up toward your collarbone. Continue the up and down exam using all three pressures until you reach your armpit.  Write Down What You Find  Write down what is normal for each breast and any changes that you find. Keep a written record with breast changes or normal findings for each breast. By writing this information down, you do not need to depend only on memory for size, tenderness, or location. Write down where you are in your menstrual cycle, if you are still menstruating. If you are having trouble noticing differences in your breasts, do not get discouraged. With time you will become more familiar with the variations in your breasts and more comfortable with the exam. How often should I examine my breasts? Examine your breasts every month. If you are breastfeeding, the best time to examine your breasts is after a feeding or after using a breast pump. If you menstruate, the best time to examine your breasts is 5-7 days after your period is over. During your period, your breasts are lumpier, and it may be more   difficult to notice changes. When should I see my health care provider? See your health care provider if you notice: A change in shape or size of your breasts or nipples. A change in the skin of your breast or nipples, such as a reddened or scaly area. Unusual discharge from your nipples. A lump or thick area that was not there before. Pain in your breasts. Anything that concerns you. Premenstrual Syndrome Premenstrual syndrome (PMS) is a group of physical, emotional, and behavioral symptoms that affect women as part of their menstrual cycle. PMS occurs 1-2 weeks before the start of a woman's menstrual period and goes away a few days after menstrual bleeding begins. PMS can range from mild to severe. What are the  causes? The exact cause of this condition is not known, but it seems to be related to hormone changes that happen before menstruation. What are the signs or symptoms? Symptoms of this condition often happen every month. They go away after your period starts. Physical symptoms of this condition include: Bloating. Breast pain or tenderness. Headaches. Extreme fatigue. Backaches. Swelling of the hands and feet. Weight gain. Hot flashes. Emotional symptoms of this condition include: Mood swings. Depression. Angry or hostile outbursts. Irritability. Anxiety. Crying spells. Behavioral symptoms include: Food cravings or appetite changes. Changes in sexual desire. Confusion. Social withdrawal. Poor concentration. How is this diagnosed? This condition may be diagnosed based on a history of your symptoms. This condition is generally diagnosed if symptoms of PMS: Are present in the 5 days before your period starts. End within 4 days after your period starts. Happen at least 3 months in a row. Interfere with some of your normal activities. Other conditions that can cause some of these symptoms must be ruled out before PMS can be diagnosed. These include depression, anxiety, anemia, and thyroid problems. How is this treated? This condition may be treated by doing the following: Maintaining a healthy lifestyle. This includes eating a well-balanced diet and exercising regularly. Taking over-the-counter medicines that can help relieve symptoms, such as cramps, aches, pain, headaches, and breast tenderness. Follow these instructions at home: Eating and drinking  Eat a well-balanced diet. Avoid caffeine and alcohol. Limit the amount of salt and salty foods you eat. This will help reduce bloating. Drink enough fluid to keep your urine pale yellow. Take a multivitamin if told to do so by your health care provider. Lifestyle  Do not use any products that contain nicotine or tobacco. These  products include cigarettes, chewing tobacco, and vaping devices, such as e-cigarettes. If you need help quitting, ask your health care provider. Exercise regularly as suggested by your health care provider. Get enough sleep. For most adults, this is 7-8 hours of sleep each night. Practice relaxation techniques, such as yoga, tai chi, or meditation. Find healthy ways to manage stress. General instructions  For 2-3 months, write down your symptoms, whether they are mild to severe, and how long they last. This will help your health care provider choose the best treatment for you. Take over-the-counter and prescription medicines only as told by your health care provider. If you are using birth control pills (oral contraceptives), use them as told by your health care provider. Contact a health care provider if: Your symptoms get worse. You develop new symptoms. You have trouble doing your daily activities. Summary Premenstrual syndrome (PMS) is a group of physical, emotional, and behavioral symptoms that affect women as part of their menstrual cycle. PMS starts 1-2 weeks before the start  of a woman's period and goes away a few days after the period starts. PMS is treated by maintaining a healthy lifestyle and taking medicines to relieve the symptoms. This information is not intended to replace advice given to you by your health care provider. Make sure you discuss any questions you have with your health care provider. Document Revised: 09/21/2019 Document Reviewed: 09/21/2019 Elsevier Patient Education  [place].

## 2021-01-01 LAB — LIPID PANEL
Cholesterol: 189 mg/dL (ref <200)
HDL: 59 mg/dL (ref >=50)
LDL Cholesterol (Calc): 105 mg/dL (calc) — ABNORMAL HIGH
Non-HDL Cholesterol (Calc): 130 mg/dL (calc) — ABNORMAL HIGH (ref <130)
Total CHOL/HDL Ratio: 3.2 (calc) (ref <5.0)
Triglycerides: 140 mg/dL (ref <150)

## 2021-01-01 LAB — COMPREHENSIVE METABOLIC PANEL
AG Ratio: 1.6 (calc) (ref 1.0–2.5)
ALT: 17 U/L (ref 6–29)
AST: 13 U/L (ref 10–30)
Albumin: 4.4 g/dL (ref 3.6–5.1)
Alkaline phosphatase (APISO): 49 U/L (ref 31–125)
BUN: 20 mg/dL (ref 7–25)
CO2: 28 mmol/L (ref 20–32)
Calcium: 9.8 mg/dL (ref 8.6–10.2)
Chloride: 103 mmol/L (ref 98–110)
Creat: 0.83 mg/dL (ref 0.50–0.99)
Globulin: 2.7 g/dL (calc) (ref 1.9–3.7)
Glucose, Bld: 98 mg/dL (ref 65–99)
Potassium: 5.5 mmol/L — ABNORMAL HIGH (ref 3.5–5.3)
Sodium: 138 mmol/L (ref 135–146)
Total Bilirubin: 0.5 mg/dL (ref 0.2–1.2)
Total Protein: 7.1 g/dL (ref 6.1–8.1)

## 2021-01-01 LAB — CBC
HCT: 43.9 % (ref 35.0–45.0)
Hemoglobin: 14.3 g/dL (ref 11.7–15.5)
MCH: 28.8 pg (ref 27.0–33.0)
MCHC: 32.6 g/dL (ref 32.0–36.0)
MCV: 88.5 fL (ref 80.0–100.0)
MPV: 12.7 fL — ABNORMAL HIGH (ref 7.5–12.5)
Platelets: 239 10*3/uL (ref 140–400)
RBC: 4.96 10*6/uL (ref 3.80–5.10)
RDW: 12.4 % (ref 11.0–15.0)
WBC: 5.9 10*3/uL (ref 3.8–10.8)

## 2021-01-01 LAB — HEMOGLOBIN A1C
Hgb A1c MFr Bld: 5.5 % of total Hgb (ref <5.7)
Mean Plasma Glucose: 111 mg/dL
eAG (mmol/L): 6.2 mmol/L

## 2021-01-02 ENCOUNTER — Other Ambulatory Visit: Payer: Self-pay | Admitting: *Deleted

## 2021-01-02 DIAGNOSIS — E875 Hyperkalemia: Secondary | ICD-10-CM

## 2021-01-05 ENCOUNTER — Other Ambulatory Visit: Payer: Self-pay | Admitting: Obstetrics and Gynecology

## 2021-01-05 DIAGNOSIS — Z1231 Encounter for screening mammogram for malignant neoplasm of breast: Secondary | ICD-10-CM

## 2021-01-22 ENCOUNTER — Other Ambulatory Visit: Payer: Self-pay

## 2021-01-22 ENCOUNTER — Other Ambulatory Visit: Payer: BC Managed Care – PPO

## 2021-01-22 DIAGNOSIS — E875 Hyperkalemia: Secondary | ICD-10-CM | POA: Diagnosis not present

## 2021-01-22 LAB — ELECTROLYTE PANEL
CO2: 25 mmol/L (ref 20–32)
Chloride: 105 mmol/L (ref 98–110)
Potassium: 4.2 mmol/L (ref 3.5–5.3)
Sodium: 138 mmol/L (ref 135–146)

## 2021-05-20 DIAGNOSIS — Z1231 Encounter for screening mammogram for malignant neoplasm of breast: Secondary | ICD-10-CM

## 2021-06-01 ENCOUNTER — Ambulatory Visit
Admission: RE | Admit: 2021-06-01 | Discharge: 2021-06-01 | Disposition: A | Discharge status: Home / Self Care | Payer: 59 | Source: Ambulatory Visit

## 2021-06-01 DIAGNOSIS — Z1231 Encounter for screening mammogram for malignant neoplasm of breast: Secondary | ICD-10-CM

## 2021-07-21 ENCOUNTER — Encounter: Payer: Self-pay | Admitting: Obstetrics and Gynecology

## 2021-07-21 MED ORDER — METRONIDAZOLE 0.75 % VA GEL: 1.0000 | Freq: Every day | VAGINAL | 0 refills | Status: DC

## 2021-07-30 ENCOUNTER — Ambulatory Visit: Payer: 59 | Admitting: Internal Medicine

## 2021-12-14 ENCOUNTER — Other Ambulatory Visit: Payer: Self-pay | Admitting: Obstetrics and Gynecology

## 2021-12-14 DIAGNOSIS — Z1231 Encounter for screening mammogram for malignant neoplasm of breast: Secondary | ICD-10-CM

## 2022-01-05 NOTE — Progress Notes (Signed)
41 y.o. [id number] Married White or Caucasian Not Hispanic or Latino female here for annual exam.  Sexually active, no pain.  Period Cycle (Days): 28 Period Duration (Days): 5 Period Pattern: Regular Menstrual Flow: Moderate Menstrual Control: Tampon Menstrual Control Change Freq (Hours): 4 Dysmenorrhea: (!) Moderate Dysmenorrhea Symptoms: Cramping  Patient's last menstrual period was 01/06/2022.          Sexually active: Yes.    The current method of family planning is vasectomy    Exercising: Yes.     Walking  Smoker:  no  Health Maintenance: Pap:   12/13/18-WNL, HPV- neg; 11/11/16- WNL  History of abnormal Pap:  no MMG:  06/01/21 Bi-rads 1 neg  BMD:   n/a Colonoscopy: none  TDaP:  10/24/12 Gardasil: complete    reports that she has never smoked. She has quit using smokeless tobacco. She reports current alcohol use. She reports that she does not use drugs. Social ETOH. 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 Son is 35, freshman in [employer]. Daughter is 38, 6th grade.   Past Medical History:  Diagnosis Date   Allergic rhinitis    Asthma    Childhood   Bicornate uterus    Migraine headache with aura    Recurrent upper respiratory infection (URI)     Past Surgical History:  Procedure Laterality Date   CESAREAN SECTION  2009 and 2012   DILATION AND CURETTAGE OF UTERUS     REFRACTIVE SURGERY      Current Outpatient Medications  Medication Sig Dispense Refill   cetirizine (ZYRTEC) 10 MG tablet Take 10 mg by mouth daily.     Multiple Vitamin (MULTIVITAMIN PO) Take by mouth.     omeprazole (PRILOSEC) 20 MG capsule Take 20 mg by mouth daily.     No current facility-administered medications for this visit.    Family History  Problem Relation Age of Onset   Asthma Mother    Allergic rhinitis Mother    Non-Hodgkin's lymphoma Mother    Diabetes Father    Heart failure Maternal Grandmother    COPD Maternal Grandmother    Hypertension Maternal Grandmother     Review of  Systems  All other systems reviewed and are negative.   Exam:   BP 110/72   Pulse 66   Ht 5' 4.5" (1.638 m)   Wt 179 lb (81.2 kg)   LMP 01/06/2022   SpO2 100%   BMI 30.25 kg/m   Weight change: '@WEIGHTCHANGE'$ @ Height:   Height: 5' 4.5" (163.8 cm)  Ht Readings from Last 3 Encounters:  01/14/22 5' 4.5" (1.638 m)  12/31/20 '5\' 4"'$  (1.626 m)  10/01/20 '5\' 4"'$  (1.626 m)    General appearance: alert, cooperative and appears stated age Head: Normocephalic, without obvious abnormality, atraumatic Neck: no adenopathy, supple, symmetrical, trachea midline and thyroid normal to inspection and palpation Lungs: clear to auscultation bilaterally Cardiovascular: regular rate and rhythm Breasts: normal appearance, no masses or tenderness Abdomen: soft, non-tender; non distended,  no masses,  no organomegaly Extremities: extremities normal, atraumatic, no cyanosis or edema Skin: Skin color, texture, turgor normal. No rashes or lesions Lymph nodes: Cervical, supraclavicular, and axillary nodes normal. No abnormal inguinal nodes palpated Neurologic: Grossly normal   Pelvic: External genitalia:  no lesions              Urethra:  normal appearing urethra with no masses, tenderness or lesions              Bartholins and Skenes:  normal                 Vagina: normal appearing vagina with normal color and discharge, no lesions              Cervix: no lesions               Bimanual Exam:  Uterus:  normal size, contour, position, consistency, mobility, non-tender and anteverted              Adnexa: no mass, fullness, tenderness               Rectovaginal: Confirms               Anus:  normal sphincter tone, no lesions  Kimalexis, RMA chaperoned for the exam.  1. Well woman exam Discussed breast self exam Discussed calcium and vit D intake Mammogram UTD No pap this year  2. Family history of diabetes mellitus - Hemoglobin A1c; Future  3. Elevated lipids - Lipid panel; Future

## 2022-01-14 ENCOUNTER — Encounter: Payer: Self-pay | Admitting: Obstetrics and Gynecology

## 2022-01-14 ENCOUNTER — Ambulatory Visit (INDEPENDENT_AMBULATORY_CARE_PROVIDER_SITE_OTHER): Payer: 59 | Admitting: Obstetrics and Gynecology

## 2022-01-14 VITALS — BP 110/72 | HR 66 | Ht 64.5 in | Wt 179.0 lb

## 2022-01-14 DIAGNOSIS — E785 Hyperlipidemia, unspecified: Secondary | ICD-10-CM

## 2022-01-14 DIAGNOSIS — Z833 Family history of diabetes mellitus: Secondary | ICD-10-CM | POA: Diagnosis not present

## 2022-01-14 DIAGNOSIS — Z01419 Encounter for gynecological examination (general) (routine) without abnormal findings: Secondary | ICD-10-CM

## 2022-01-14 DIAGNOSIS — N926 Irregular menstruation, unspecified: Secondary | ICD-10-CM | POA: Insufficient documentation

## 2022-01-14 DIAGNOSIS — E282 Polycystic ovarian syndrome: Secondary | ICD-10-CM | POA: Insufficient documentation

## 2022-01-14 NOTE — Patient Instructions (Signed)

## 2022-01-21 ENCOUNTER — Other Ambulatory Visit: Payer: 59

## 2022-01-28 ENCOUNTER — Other Ambulatory Visit: Payer: 59

## 2022-02-02 ENCOUNTER — Other Ambulatory Visit: Payer: 59

## 2022-04-01 ENCOUNTER — Other Ambulatory Visit: Payer: 59

## 2022-04-07 ENCOUNTER — Other Ambulatory Visit: Payer: 59

## 2022-04-07 DIAGNOSIS — Z833 Family history of diabetes mellitus: Secondary | ICD-10-CM

## 2022-04-07 DIAGNOSIS — E785 Hyperlipidemia, unspecified: Secondary | ICD-10-CM

## 2022-04-08 LAB — HEMOGLOBIN A1C
Hgb A1c MFr Bld: 5.8 % of total Hgb — ABNORMAL HIGH (ref <5.7)
Mean Plasma Glucose: 120 mg/dL
eAG (mmol/L): 6.6 mmol/L

## 2022-04-08 LAB — LIPID PANEL
Cholesterol: 185 mg/dL (ref <200)
HDL: 54 mg/dL (ref >=50)
LDL Cholesterol (Calc): 98 mg/dL (calc)
Non-HDL Cholesterol (Calc): 131 mg/dL (calc) — ABNORMAL HIGH (ref <130)
Total CHOL/HDL Ratio: 3.4 (calc) (ref <5.0)
Triglycerides: 213 mg/dL — ABNORMAL HIGH (ref <150)

## 2022-06-03 ENCOUNTER — Ambulatory Visit
Admission: RE | Admit: 2022-06-03 | Discharge: 2022-06-03 | Disposition: A | Discharge status: Home / Self Care | Payer: 59 | Source: Ambulatory Visit | Attending: Obstetrics and Gynecology | Admitting: Obstetrics and Gynecology

## 2022-06-03 DIAGNOSIS — Z1231 Encounter for screening mammogram for malignant neoplasm of breast: Secondary | ICD-10-CM

## 2022-07-28 ENCOUNTER — Telehealth: Payer: Self-pay

## 2022-07-28 DIAGNOSIS — R102 Pelvic and perineal pain: Secondary | ICD-10-CM

## 2022-07-28 DIAGNOSIS — N946 Dysmenorrhea, unspecified: Secondary | ICD-10-CM

## 2022-07-28 NOTE — Telephone Encounter (Signed)
Pt LVM in triage line c/o strong cramping and wasn't sure if needed a test or an appt, desired a cb.  Spoke w/ pt:  Location: lower abdomen/pelvic area Radiation: lower back Onset: Sudden  Pattern: intermittent prominent cramping, consistent dull achy cramping Severity: 10 yesterday evening (felt as if going to be sick), no NSAIDs or heat helped Recurrent: no, has cramping with cycles but never this bad Cause: was due for cycle on 6/9 but is usually ~2 days late. LMP: 07/27/2022 Relieving/aggravating factors: when at most severity (only able to be in fetal position), is able to move around today but is uncomfortable. Other sxs: denies GI issues other than feeling sick when pain was at most severe. Pregnancy: partner w/ VAS, LMP: 07/27/2022  Please advise.  Not on any hormonal BC d/t hx of migraines w/ aura.

## 2022-07-28 NOTE — Telephone Encounter (Signed)
Pt notified and voiced understanding. Accepted appt for 12 tomorrow. Placed [place] order. Will route to provider for final review and close.

## 2022-07-28 NOTE — Telephone Encounter (Signed)
If her pain is severe today she should go to the ER. Otherwise please try to add her to the u/s schedule and then my schedule tomorrow.

## 2022-07-29 ENCOUNTER — Ambulatory Visit (INDEPENDENT_AMBULATORY_CARE_PROVIDER_SITE_OTHER): Payer: 59

## 2022-07-29 ENCOUNTER — Telehealth: Payer: 59 | Admitting: Obstetrics and Gynecology

## 2022-07-29 ENCOUNTER — Telehealth: Payer: Self-pay | Admitting: Obstetrics and Gynecology

## 2022-07-29 DIAGNOSIS — N946 Dysmenorrhea, unspecified: Secondary | ICD-10-CM | POA: Diagnosis not present

## 2022-07-29 NOTE — Progress Notes (Deleted)
Virtual Visit via Video Note  I connected with Leslie Barnes on 07/29/22 at  2:45 PM EDT by a video enabled telemedicine application and verified that I am speaking with the correct person using two identifiers.  Location: Patient: *** Provider: ***   I discussed the limitations of evaluation and management by telemedicine and the availability of in person appointments. The patient expressed understanding and agreed to proceed.  GYNECOLOGY  VISIT   HPI: 42 y.o.   Married White or Caucasian Not Hispanic or Latino  female   [id number] with No LMP recorded.   here for a virtual visit to discuss pelvic pain and ultrasound results. She called yesterday reporting severe pelvic pain the night prior, the same day her cycle started.   GYNECOLOGIC HISTORY: No LMP recorded. Contraception:vasectomy Menopausal hormone therapy: no        OB History     Gravida  5   Para  2   Term  1   Preterm  1   AB  3   Living  2      SAB  3   IAB  0   Ectopic  0   Multiple  0   Live Births  2              Patient Active Problem List   Diagnosis Date Noted   Irregular menstrual cycle 01/14/2022   Polycystic ovaries 01/14/2022   Migraine headache with aura 12/31/2020   Adhesive capsulitis of left shoulder 01/30/2018   Shoulder pain, left 11/04/2017    Past Medical History:  Diagnosis Date   Allergic rhinitis    Asthma    Childhood   Bicornate uterus    Migraine headache with aura    Recurrent upper respiratory infection (URI)     Past Surgical History:  Procedure Laterality Date   CESAREAN SECTION  2009 and 2012   DILATION AND CURETTAGE OF UTERUS     REFRACTIVE SURGERY      Current Outpatient Medications  Medication Sig Dispense Refill   cetirizine (ZYRTEC) 10 MG tablet Take 10 mg by mouth daily.     Multiple Vitamin (MULTIVITAMIN PO) Take by mouth.     omeprazole (PRILOSEC) 20 MG capsule Take 20 mg by mouth daily.     No current facility-administered medications  for this visit.     ALLERGIES: Latex and Pollen extract  Family History  Problem Relation Age of Onset   Asthma Mother    Allergic rhinitis Mother    Non-Hodgkin's lymphoma Mother    Diabetes Father    Heart failure Maternal Grandmother    COPD Maternal Grandmother    Hypertension Maternal Grandmother     Social History   Socioeconomic History   Marital status: Married    Spouse name: Not on file   Number of children: Not on file   Years of education: Not on file   Highest education level: Not on file  Occupational History   Not on file  Tobacco Use   Smoking status: Never   Smokeless tobacco: Former  Building services engineer Use: Never used  Substance and Sexual Activity   Alcohol use: Yes    Comment: socially   Drug use: Never   Sexual activity: Yes    Comment: spouse with vasectomy  Other Topics Concern   Not on file  Social History Narrative   Not on file   Social Determinants of Health   Financial Resource Strain: Not on file  Food Insecurity: Not on file  Transportation Needs: Not on file  Physical Activity: Not on file  Stress: Not on file  Social Connections: Not on file  Intimate Partner Violence: Not on file    ROS  PHYSICAL EXAMINATION:    There were no vitals taken for this visit.    General appearance: alert, cooperative and appears stated age

## 2022-07-29 NOTE — Telephone Encounter (Signed)
Patient notified of results. She declines video visit for the afternoon and will reach out with any concerns or additional pain.

## 2022-07-29 NOTE — Telephone Encounter (Signed)
Please let the patient know that her ultrasound shows an incidental finding of 2 small fibroids (not concerning), otherwise it's normal. Nothing to explain her pain. If she is still having significant pain I would recommend she f/u with her primary. If she still wants to do a video visit later today, that's fine as well.

## 2022-08-25 ENCOUNTER — Encounter: Payer: Self-pay | Admitting: Obstetrics and Gynecology

## 2022-08-25 NOTE — Telephone Encounter (Signed)
Yes please send her that information and make an appt for consult with me (not a slot)

## 2022-08-26 NOTE — Telephone Encounter (Signed)
I don't offer telehealth for possible hormone management, need vitals done.

## 2022-08-30 ENCOUNTER — Ambulatory Visit: Payer: 59 | Admitting: Obstetrics and Gynecology

## 2022-09-03 NOTE — Progress Notes (Unsigned)
GYNECOLOGY  VISIT   HPI: 42 y.o.   Married  Caucasian  female   [phone] with Patient's last menstrual period was 08/23/2022 (exact date).   here for   hormonal imbalance and anxiety.    Pelvic US 07/2022 for pain. Has fibroids.  Notes onset of poor sleep, anxiety after the pain episode.   Experiencing hot flashes as well.   Monthly menstrual cycles.  Occur every 28 - 33 days. Mid cycle brown spot on maybe 2 cycles.  She is not certain if she really has the dx of PCOS.  Working on weight loss, cutting down on caffeine and ETOH use.  No new stressors.   65 yo daughter and 47 yo son.   GYNECOLOGIC HISTORY: Patient's last menstrual period was 08/23/2022 (exact date). Contraception:  vasectomy Menopausal hormone therapy:  n/a Last mammogram:  06/03/22 Breast Density Cat C, BI-RADS CAT 1 neg Last pap smear:   12/13/18 neg: HR HPV neg        OB History     Gravida  5   Para  2   Term  1   Preterm  1   AB  3   Living  2      SAB  3   IAB  0   Ectopic  0   Multiple  0   Live Births  2              Patient Active Problem List   Diagnosis Date Noted   Irregular menstrual cycle 01/14/2022   Polycystic ovaries 01/14/2022   Migraine headache with aura 12/31/2020   Adhesive capsulitis of left shoulder 01/30/2018   Shoulder pain, left 11/04/2017    Past Medical History:  Diagnosis Date   Allergic rhinitis    Asthma    Childhood   Bicornate uterus    Migraine headache with aura    Recurrent upper respiratory infection (URI)     Past Surgical History:  Procedure Laterality Date   CESAREAN SECTION  2009 and 2012   DILATION AND CURETTAGE OF UTERUS     REFRACTIVE SURGERY      Current Outpatient Medications  Medication Sig Dispense Refill   Calcium Carbonate Antacid (TUMS PO) Take by mouth.     cetirizine (ZYRTEC) 10 MG tablet Take 10 mg by mouth daily.     Multiple Vitamin (MULTIVITAMIN PO) Take by mouth.     omeprazole (PRILOSEC) 20 MG capsule  Take 20 mg by mouth as needed.     No current facility-administered medications for this visit.     ALLERGIES: Latex and Pollen extract  Family History  Problem Relation Age of Onset   Asthma Mother    Allergic rhinitis Mother    Non-Hodgkin's lymphoma Mother    Heart failure Maternal Grandmother    COPD Maternal Grandmother    Hypertension Maternal Grandmother     Social History   Socioeconomic History   Marital status: Married    Spouse name: Not on file   Number of children: Not on file   Years of education: Not on file   Highest education level: Not on file  Occupational History   Not on file  Tobacco Use   Smoking status: Never   Smokeless tobacco: Never  Vaping Use   Vaping status: Never Used  Substance and Sexual Activity   Alcohol use: Yes    Comment: socially   Drug use: Never   Sexual activity: Yes    Partners: Male  Birth control/protection: Other-see comments    Comment: spouse with vasectomy  Other Topics Concern   Not on file  Social History Narrative   Not on file   Social Determinants of Health   Financial Resource Strain: Not on file  Food Insecurity: Not on file  Transportation Needs: Not on file  Physical Activity: Not on file  Stress: Not on file  Social Connections: Not on file  Intimate Partner Violence: Not on file    Review of Systems  Constitutional:        Hot flashes  HENT: Negative.    Eyes: Negative.   Respiratory: Negative.    Cardiovascular: Negative.   Gastrointestinal: Negative.   Endocrine: Negative.   Genitourinary: Negative.   Musculoskeletal: Negative.   Skin: Negative.   Allergic/Immunologic: Negative.   Neurological: Negative.   Psychiatric/Behavioral:         Anxiety    PHYSICAL EXAMINATION:    BP 114/70   Pulse 78   Wt 164 lb (74.4 kg)   LMP 08/23/2022 (Exact Date)   SpO2 97%   BMI 27.72 kg/m     General appearance: alert, cooperative and appears stated age  ASSESSMENT  Anxiety.   Irregular menses.  Hot flashes.   Possible perimenopausal changes.  Hx migraine with aura.  PLAN  Will check TSH.  Micronor trial 3 months, 1 refill.   We discussed potential Mirena.  AEX in Nov.  24 min  total time was spent for this patient encounter, including preparation, face-to-face counseling with the patient, coordination of care, and documentation of the encounter.

## 2022-09-08 ENCOUNTER — Ambulatory Visit (INDEPENDENT_AMBULATORY_CARE_PROVIDER_SITE_OTHER): Payer: 59 | Admitting: Obstetrics and Gynecology

## 2022-09-08 ENCOUNTER — Encounter: Payer: Self-pay | Admitting: Obstetrics and Gynecology

## 2022-09-08 VITALS — BP 114/70 | HR 78 | Wt 164.0 lb

## 2022-09-08 DIAGNOSIS — F419 Anxiety disorder, unspecified: Secondary | ICD-10-CM

## 2022-09-08 DIAGNOSIS — N926 Irregular menstruation, unspecified: Secondary | ICD-10-CM

## 2022-09-08 MED ORDER — NORETHINDRONE 0.35 MG PO TABS: 1.0000 | ORAL_TABLET | Freq: Every day | ORAL | 1 refills | Status: DC

## 2022-09-08 NOTE — Patient Instructions (Signed)
Norethindrone Tablets (Contraception) What is this medication? NORETHINDRONE (nor eth IN drone) prevents ovulation and pregnancy. It belongs to a group of medications called contraceptives. This medication is a progestin hormone. This medicine may be used for other purposes; ask your health care provider or pharmacist if you have questions. COMMON BRAND NAME(S): Camila, Deblitane 28-Day, Errin, [place], [place], [place], [place], Nor-QD, Nora-BE, Norlyroc, Ortho Micronor, [employer] 28-Day What should I tell my care team before I take this medication? They need to know if you have any of these conditions: Blood vessel disease or blood clots Breast, cervical, or vaginal cancer Diabetes Heart disease Kidney disease Liver disease Mental depression Migraine Seizures Stroke Vaginal bleeding An unusual or allergic reaction to norethindrone, other medications, foods, dyes, or preservatives Pregnant or trying to get pregnant Breast-feeding How should I use this medication? Take this medication by mouth with a glass of water. You may take it with or without food. Follow the directions on the prescription label. Take this medication at the same time each day and in the order directed on the package. Do not take your medication more often than directed. Contact your care team about the use of this medication in children. Special care may be needed. This medication has been used in female children who have started having menstrual periods. A patient package insert for the product will be given with each prescription and refill. Read this sheet carefully each time. The sheet may change frequently. Overdosage: If you think you have taken too much of this medicine contact a poison control center or emergency room at once. NOTE: This medicine is only for you. Do not share this medicine with others. What if I miss a dose? If you miss a dose, refer to the patient package insert for instruction. This  medication may not work as well if you miss more than 1 pill. You may need to use back-up contraception. What may interact with this medication? Do not take this medication with any of the following: Amprenavir or fosamprenavir Bosentan This medication may also interact with the following: Antibiotics or medications for infections, especially rifampin, rifabutin, rifapentine, and griseofulvin, and possibly penicillins or tetracyclines Aprepitant Barbiturate medications, such as phenobarbital Carbamazepine Felbamate Modafinil Oxcarbazepine Phenytoin Ritonavir or other medications for HIV infection or AIDS St. John's wort Topiramate This list may not describe all possible interactions. Give your health care provider a list of all the medicines, herbs, non-prescription drugs, or dietary supplements you use. Also tell them if you smoke, drink alcohol, or use illegal drugs. Some items may interact with your medicine. What should I watch for while using this medication? Visit your care team for regular health checks while on this medication. You may need to use another form of contraception, such as a condom, during the first cycle of this medication. If you may be pregnant, stop taking this medication right away and contact your care team. What side effects may I notice from receiving this medication? Side effects that you should report to your care team as soon as possible: Allergic reactions--skin rash, itching, hives, swelling of the face, lips, tongue, or throat Blood clot--pain, swelling, or warmth in the leg, shortness of breath, chest pain Gallbladder problems--severe stomach pain, nausea, vomiting, fever Increase in blood pressure Liver injury--right upper belly pain, loss of appetite, nausea, light-colored stool, dark yellow or brown urine, yellowing skin or eyes, unusual weakness or fatigue New or worsening migraines or headaches Stroke--sudden numbness or weakness of the face,  arm, or leg,  trouble speaking, confusion, trouble walking, loss of balance or coordination, dizziness, severe headache, change in vision Unusual vaginal discharge, itching, or odor Worsening mood, feelings of depression Side effects that usually do not require medical attention (report to your care team if they continue or are bothersome): Breast pain or tenderness Dark patches of skin on the face or other sun-exposed areas Irregular menstrual cycles or spotting Nausea Weight gain This list may not describe all possible side effects. Call your doctor for medical advice about side effects. You may report side effects to FDA at 1-800-FDA-1088. Where should I keep my medication? Keep out of the reach of children and pets. Store at room temperature between 15 and 30 degrees C (59 and 86 degrees F). Throw away any unused medication after the expiration date. NOTE: This sheet is a summary. It may not cover all possible information. If you have questions about this medicine, talk to your doctor, pharmacist, or health care provider.  2024 Elsevier/Gold Standard (2022-07-09 00:00:00)

## 2022-09-10 ENCOUNTER — Encounter: Payer: Self-pay | Admitting: Obstetrics and Gynecology

## 2022-09-10 NOTE — Telephone Encounter (Signed)
Seen on 09/08/2022.

## 2022-09-14 ENCOUNTER — Other Ambulatory Visit: Payer: Self-pay | Admitting: Obstetrics and Gynecology

## 2022-09-14 DIAGNOSIS — N926 Irregular menstruation, unspecified: Secondary | ICD-10-CM

## 2022-09-21 ENCOUNTER — Telehealth: Payer: Self-pay

## 2022-09-21 NOTE — Telephone Encounter (Signed)
Pt LVM in triage line this AM stating that she started her cycle yesterday evening so wanted to make cycle day #3 appt for tomorrow 09/22/2022 and also wanted to inquire what labs were being done and if needed to fast?  Spoke w/ pt: scheduled her for 09/22/2022 @ 0830, advised her labs were for estradiol and FSH levels, and advised her that fasting was not necessary. Pt voiced understanding and appreciation for cb. Routing to provider for final review and closing.

## 2022-09-22 ENCOUNTER — Other Ambulatory Visit: Payer: 59

## 2022-09-22 ENCOUNTER — Other Ambulatory Visit: Payer: Self-pay | Admitting: Obstetrics and Gynecology

## 2022-09-22 DIAGNOSIS — N926 Irregular menstruation, unspecified: Secondary | ICD-10-CM

## 2022-09-23 NOTE — Telephone Encounter (Signed)
Routing to provider for final review. Will respond to pt via result note.

## 2022-09-23 NOTE — Telephone Encounter (Signed)
Add-on request given to lab tech.

## 2022-10-15 ENCOUNTER — Encounter: Payer: Self-pay | Admitting: Podiatry

## 2022-10-15 ENCOUNTER — Ambulatory Visit: Payer: 59 | Admitting: Podiatry

## 2022-10-15 DIAGNOSIS — S99922A Unspecified injury of left foot, initial encounter: Secondary | ICD-10-CM | POA: Diagnosis not present

## 2022-10-15 NOTE — Progress Notes (Signed)
  Subjective:  Patient ID: Leslie Barnes, female    DOB: 1981-02-11,   MRN: [id number]  Chief Complaint  Patient presents with   Nail Problem    "Stubbed baby toe on left foot the  Nail is hurting.and sore when I walk"     42 y.o. female presents for concern of left toenail injury on pinky toe that occurred three weeks ago. Relates she hit the toe on her couch. States the nail did bleed and was able to take some off but as continued to cause pain and was concerned . Denies any other pedal complaints. Denies n/v/f/c.   Past Medical History:  Diagnosis Date   Allergic rhinitis    Asthma    Childhood   Bicornate uterus    Migraine headache with aura    Recurrent upper respiratory infection (URI)     Objective:  Physical Exam: Vascular: DP/PT pulses 2/4 bilateral. CFT <3 seconds. Normal hair growth on digits. No edema.  Skin. No lacerations or abrasions bilateral feet. Left fifth digit nail with distal medial border loosened from nail bed and new nail growing in.  Musculoskeletal: MMT 5/5 bilateral lower extremities in DF, PF, Inversion and Eversion. Deceased ROM in DF of ankle joint.  Neurological: Sensation intact to light touch.   Assessment:   1. Injury of toenail of left foot, initial encounter      Plan:  Patient was evaluated and treated and all questions answered. Discussed ingrown toenails etiology and treatment options including procedure for removal vs conservative care.  Nail debrided back to patient comfort.  Discussed course from now and may need removal in future Should continue to improve Return as needed.    Louann Sjogren, DPM

## 2022-12-09 ENCOUNTER — Other Ambulatory Visit: Payer: Self-pay

## 2022-12-09 ENCOUNTER — Encounter: Payer: Self-pay | Admitting: Allergy

## 2022-12-09 ENCOUNTER — Ambulatory Visit: Payer: 59 | Admitting: Allergy

## 2022-12-09 VITALS — BP 100/80 | HR 65 | Temp 98.7°F | Resp 16 | Ht 64.57 in | Wt 150.5 lb

## 2022-12-09 DIAGNOSIS — H109 Unspecified conjunctivitis: Secondary | ICD-10-CM

## 2022-12-09 DIAGNOSIS — J310 Chronic rhinitis: Secondary | ICD-10-CM | POA: Diagnosis not present

## 2022-12-09 DIAGNOSIS — H1013 Acute atopic conjunctivitis, bilateral: Secondary | ICD-10-CM | POA: Diagnosis not present

## 2022-12-09 MED ORDER — RYALTRIS 665-25 MCG/ACT NA SUSP: 2.0000 | Freq: Two times a day (BID) | NASAL | 5 refills | Status: DC

## 2022-12-09 NOTE — Progress Notes (Signed)
New Patient Note  RE: Leslie Barnes MRN: [id number] DOB: 1980-09-13 Date of Office Visit: 12/09/2022  Primary care provider: None at this time  Chief Complaint: allergies  History of present illness: Leslie Barnes is a 42 y.o. female presenting today for evaluation of allergic rhinitis.   Discussed the use of AI scribe software for clinical note transcription with the patient, who gave verbal consent to proceed.  She reports year-round symptoms that have worsened since moving here from [place] five years ago. Symptoms include itchy, watery eyes, runny nose, and frequent sneezing. She reports these symptoms to be particularly severe at the beginning and end of summer.  He has been managing these symptoms with intermittent use of Alavert and Flonase, which she reports to be effective. She also use allergy eye drops, which provide inconsistent relief.  However this eyedrop is most likely an Visine type of eyedrop and not a true allergy relief drop.  The patient denies a history of sinus infections but does report pressure in the cheeks and forehead during severe allergy episodes.  In addition to environmental allergies, the patient has noticed occasional stomach discomfort after consuming eggs.  She is unsure if this is due to quantity or timing of consumption, or a potential food allergy.  She denies any other food-related issues and does not avoid any foods in their diet.  The patient also reports a history of eczema, currently experiencing a flare-up on the back of the neck. Over-the-counter hydrocortisone cream has been effective in managing this.  The patient has previously taken Prilosec for reflux but has discontinued this medication in an attempt to manage symptoms through dietary changes.  Review of systems: 10pt ROS negative unless noted above in HPI  All other systems negative unless noted above in HPI  Past medical history: Past Medical History:  Diagnosis Date   Allergic  rhinitis    Bicornate uterus    Migraine headache with aura     Past surgical history: Past Surgical History:  Procedure Laterality Date   CESAREAN SECTION  2009 and 2012   DILATION AND CURETTAGE OF UTERUS     REFRACTIVE SURGERY      Family history:  Family History  Problem Relation Age of Onset   Asthma Mother    Allergic rhinitis Mother    Non-Hodgkin's lymphoma Mother    Heart failure Maternal Grandmother    COPD Maternal Grandmother    Hypertension Maternal Grandmother     Social history: Lives in a home without carpeting with gas heating and central cooling.  Dogs in the home.  There is no concern for water damage, mildew or roaches in the home.  She is a Pharmacist, hospital.  Denies a smoking history.   Medication List: Current Outpatient Medications  Medication Sig Dispense Refill   Calcium Carbonate Antacid (TUMS PO) Take by mouth.     cetirizine (ZYRTEC) 10 MG tablet Take 10 mg by mouth daily.     Multiple Vitamin (MULTIVITAMIN PO) Take by mouth.     norethindrone (ORTHO MICRONOR) 0.35 MG tablet Take 1 tablet (0.35 mg total) by mouth daily. 84 tablet 1   No current facility-administered medications for this visit.    Known medication allergies: Allergies  Allergen Reactions   Latex Hives   Pollen Extract Other (See Comments)     Physical examination: Blood pressure 100/80, pulse 65, temperature 98.7 F (37.1 C), resp. rate 16, height 5' 4.57" (1.64 m), weight 150 lb 8 oz (68.3 kg), SpO2  100%.  General: Alert, interactive, in no acute distress. HEENT: PERRLA, TMs pearly gray, turbinates non-edematous without discharge, post-pharynx non erythematous. Neck: Supple without lymphadenopathy. Lungs: Clear to auscultation without wheezing, rhonchi or rales. {no increased work of breathing. CV: Normal S1, S2 without murmurs. Abdomen: Nondistended, nontender. Skin: Warm and dry, without lesions or rashes. Extremities:  No clubbing, cyanosis or  edema. Neuro:   Grossly intact.  Diagnositics/Labs: None today  Assessment and plan:   Allergic Rhinitis Year-round symptoms of itchy, watery eyes, sneezing, and runny nose, exacerbated in the beginning and end of summer. Currently managed with intermittent use of Alavert. -Plan for environmental allergy testing on 12/17/22 at 8:30am -Consider rotating antihistamines (Alavert, Allegra or Xyzal are effective antihistamines) every 3-6 months if daily use is required. -Use Ryaltris 2 sprays each nostril twice a day as needed for nasal congestion or drainage.  This is a combination spray with olopatadine-antihistamine for drainage control and mometasone-steroid for congestion control. With using nasal sprays point tip of bottle toward eye on same side nostril and lean head slightly forward for best technique.   -Consider Pataday, Alaway or Zaditor for over-the-counter allergy eye drops. -Consider allergy shots if medication management is insufficient.  Eczema Dry, itchy patch on the back of the neck, managed with over-the-counter hydrocortisone. -Continue hydrocortisone as needed.  Possible Food Allergy Occasional stomach discomfort after eating eggs, unclear if related to quantity or timing of consumption. -Plan for food allergy testing.  Plan for environmental allergy 55+ foods 1-13  hold antihistamines for 3 days prior to skin testing on 12/17/22  I appreciate the opportunity to take part in Aften's care. Please do not hesitate to contact me with questions.  Sincerely,   Margo Aye, MD Allergy/Immunology [facility]

## 2022-12-09 NOTE — Patient Instructions (Addendum)
Allergic Rhinitis Year-round symptoms of itchy, watery eyes, sneezing, and runny nose, exacerbated in the beginning and end of summer. Currently managed with intermittent use of Alavert. -Plan for environmental allergy testing on 12/17/22 at 8:30am -Consider rotating antihistamines (Alavert, Allegra or Xyzal are effective antihistamines) every 3-6 months if daily use is required. -Use Ryaltris 2 sprays each nostril twice a day as needed for nasal congestion or drainage.  This is a combination spray with olopatadine-antihistamine for drainage control and mometasone-steroid for congestion control. With using nasal sprays point tip of bottle toward eye on same side nostril and lean head slightly forward for best technique.   -Consider Pataday, Alaway or Zaditor for over-the-counter allergy eye drops. -Consider allergy shots if medication management is insufficient.  Eczema Dry, itchy patch on the back of the neck, managed with over-the-counter hydrocortisone. -Continue hydrocortisone as needed.  Possible Food Allergy Occasional stomach discomfort after eating eggs, unclear if related to quantity or timing of consumption. -Plan for food allergy testing.  Hold antihistamines for 3 days prior to skin testing on 12/17/22

## 2022-12-17 ENCOUNTER — Encounter: Payer: Self-pay | Admitting: Allergy

## 2022-12-17 ENCOUNTER — Ambulatory Visit (INDEPENDENT_AMBULATORY_CARE_PROVIDER_SITE_OTHER): Payer: 59 | Admitting: Allergy

## 2022-12-17 DIAGNOSIS — R1084 Generalized abdominal pain: Secondary | ICD-10-CM | POA: Diagnosis not present

## 2022-12-17 DIAGNOSIS — J3089 Other allergic rhinitis: Secondary | ICD-10-CM

## 2022-12-17 DIAGNOSIS — H1013 Acute atopic conjunctivitis, bilateral: Secondary | ICD-10-CM | POA: Diagnosis not present

## 2022-12-17 DIAGNOSIS — J302 Other seasonal allergic rhinitis: Secondary | ICD-10-CM | POA: Diagnosis not present

## 2022-12-17 DIAGNOSIS — L2089 Other atopic dermatitis: Secondary | ICD-10-CM

## 2022-12-17 NOTE — Progress Notes (Signed)
Follow-up Note  RE: Leslie Barnes MRN: [id number] DOB: [birth year] Date of Office Visit: 12/17/2022   History of present illness: Leslie Barnes is a 42 y.o. female presenting today for skin testing visit.  She was last seen in the office on 12/09/2022 by myself for allergic rhinitis, eczema and possible food allergy.  She has held antihistamines for at least the past 3 days for testing today.  She is in her usual state of health today.  Medication List: Current Outpatient Medications  Medication Sig Dispense Refill   Calcium Carbonate Antacid (TUMS PO) Take by mouth.     cetirizine (ZYRTEC) 10 MG tablet Take 10 mg by mouth daily.     Multiple Vitamin (MULTIVITAMIN PO) Take by mouth.     Olopatadine-Mometasone (RYALTRIS) [id number] MCG/ACT SUSP Place 2 sprays into the nose 2 (two) times daily. 29 g 5   No current facility-administered medications for this visit.     Known medication allergies: Allergies  Allergen Reactions   Latex Hives   Pollen Extract Other (See Comments)     Physical examination (limited):  General: Alert, interactive, in no acute distress. Skin: Warm and dry, without lesions or rashes. Extremities:  No clubbing, cyanosis or edema.  Diagnositics/Labs:  Allergy testing:   Airborne Adult Perc - 12/17/22 0800     Time Antigen Placed 0840    Allergen Manufacturer Waynette Buttery    Location Back    Number of Test 55    1. Control-Buffer 50% Glycerol Negative    2. Control-Histamine 2+    3. Bahia Negative    4. French Southern Territ[place] Negative    5. Johnson Negative    6. Kentucky Blue 3+    7. Meadow Fescue 3+    8. Perennial Rye 4+    9. Timothy 3+    10. Ragweed Mix 2+    11. Cocklebur 2+    12. Plantain,  English Negative    13. Baccharis Negative    14. Dog Fennel Negative    15. Russian Thistle Negative    16. Lamb's Quarters Negative    17. Sheep Sorrell Negative    18. Rough Pigweed Negative    19. Marsh Elder, Rough Negative    20. Mugwort, Common Negative     21. Box, Elder Negative    22. Cedar, red Negative    23. Sweet Gum Negative    24. Pecan Pollen Negative    25. Pine Mix Negative    26. Walnut, Black Pollen 2+    27. Red Mulberry Negative    28. Ash Mix 2+    29. Birch Mix Negative    30. Beech American Negative    31. Cottonwood, Guinea-[place]iss[place] Negative    32. Hickory, White 2+    33. Maple Mix Negative    34. Oak, [place] Mix 3+    35. Sycamore Eastern Negative    36. Alternaria Alternata Negative    37. Cladosporium Herbarum Negative    38. Aspergillus Mix Negative    39. Penicillium Mix Negative    40. Bipolaris Sorokiniana (Helminthosporium) Negative    41. Drechslera Spicifera (Curvularia) Negative    42. Mucor Plumbeus 2+    43. Fusarium Moniliforme Negative    44. Aureobasidium Pullulans (pullulara) Negative    45. Rhizopus Oryzae Negative    46. Botrytis Cinera Negative    47. Epicoccum Nigrum Negative    48. Phoma Betae Negative    49. Dust Mite Mix 2+    50.  Cat Hair 10,000 BAU/ml Negative    51.  Dog Epithelia 2+    52. Mixed Feathers Negative    53. Horse Epithelia Negative    54. Cockroach, German 2+    55. Tobacco Leaf Negative             13 Food Perc - 12/17/22 0800       Test Information   Time Antigen Placed 0840    Allergen Manufacturer Waynette Buttery    Location Back    Number of allergen test 13      Food   1. Peanut Negative    2. Soybean Negative    3. Wheat Negative    4. Sesame Negative    5. Milk, Cow Negative    6. Casein Negative    7. Egg White, Chicken Negative    8. Shellfish Mix Negative    9. Fish Mix Negative    10. Cashew Negative    11. Walnut Food Negative    12. Almond Negative    13. Hazelnut Negative             Allergy testing results were read and interpreted by provider, documented by clinical staff.   Assessment and plan: Allergic Rhinitis Year-round symptoms of itchy, watery eyes, sneezing, and runny nose, exacerbated in the beginning and end of summer.  Currently managed with intermittent use of Alavert. -Testing today showed: grasses, ragweed, weeds, trees, outdoor molds, dust mites, dog, and cockroach -Copy of test results provided.  -Avoidance measures provided. -Consider rotating antihistamines (Alavert, Allegra or Xyzal are effective antihistamines) every 3-6 months if daily use is required. -Use Ryaltris 2 sprays each nostril twice a day as needed for nasal congestion or drainage.  This is a combination spray with olopatadine-antihistamine for drainage control and mometasone-steroid for congestion control. With using nasal sprays point tip of bottle toward eye on same side nostril and lean head slightly forward for best technique.   -Consider Pataday, Alaway or Zaditor for over-the-counter allergy eye drops. -Consider allergy shots if medication management is insufficient.  Allergy shots "re-train" and "reset" the immune system to ignore environmental allergens and decrease the resulting immune response to those allergens (sneezing, itchy watery eyes, runny nose, nasal congestion, etc).  Allergy shots improve symptoms in 75-85% of patients.   Eczema Dry, itchy patch on the back of the neck, managed with over-the-counter hydrocortisone. -Continue hydrocortisone as needed.  Food Intolerance Occasional stomach discomfort after eating eggs, unclear if related to quantity or timing of consumption. -Common food allergy skin testing is negative.  You do not have IgE to these foods.   -Below are different food issue categories:   Allergy: food allergy is when you have eaten a food, developed an allergic reaction after eating the food and have IgE to the food (positive food testing either by skin testing or blood testing).  Food allergy could lead to life threatening symptoms  Sensitivity: occurs when you have IgE to a food (positive food testing either by skin testing or blood testing) but is a food you eat without any issues.  This is not an  allergy and we recommend keeping the food in the diet  Intolerance: this is when you have negative testing by either skin testing or blood testing thus not allergic but the food causes symptoms (like belly pain, bloating, diarrhea etc) with ingestion.  These foods should be avoided to prevent symptoms.    Follow-up in 4-6 months or sooner if needed  I  appreciate the opportunity to take part in Bette's care. Please do not hesitate to contact me with questions.  Sincerely,   Margo Aye, MD Allergy/Immunology [facility]

## 2022-12-17 NOTE — Patient Instructions (Signed)
Allergic Rhinitis Year-round symptoms of itchy, watery eyes, sneezing, and runny nose, exacerbated in the beginning and end of summer. Currently managed with intermittent use of Alavert. -Testing today showed: grasses, ragweed, weeds, trees, outdoor molds, dust mites, dog, and cockroach -Copy of test results provided.  -Avoidance measures provided. -Consider rotating antihistamines (Alavert, Allegra or Xyzal are effective antihistamines) every 3-6 months if daily use is required. -Use Ryaltris 2 sprays each nostril twice a day as needed for nasal congestion or drainage.  This is a combination spray with olopatadine-antihistamine for drainage control and mometasone-steroid for congestion control. With using nasal sprays point tip of bottle toward eye on same side nostril and lean head slightly forward for best technique.   -Consider Pataday, Alaway or Zaditor for over-the-counter allergy eye drops. -Consider allergy shots if medication management is insufficient.  Allergy shots "re-train" and "reset" the immune system to ignore environmental allergens and decrease the resulting immune response to those allergens (sneezing, itchy watery eyes, runny nose, nasal congestion, etc).  Allergy shots improve symptoms in 75-85% of patients.   Eczema Dry, itchy patch on the back of the neck, managed with over-the-counter hydrocortisone. -Continue hydrocortisone as needed.  Food Intolerance Occasional stomach discomfort after eating eggs, unclear if related to quantity or timing of consumption. -Common food allergy skin testing is negative.  You do not have IgE to these foods.   -Below are different food issue categories:   Allergy: food allergy is when you have eaten a food, developed an allergic reaction after eating the food and have IgE to the food (positive food testing either by skin testing or blood testing).  Food allergy could lead to life threatening symptoms  Sensitivity: occurs when you have IgE  to a food (positive food testing either by skin testing or blood testing) but is a food you eat without any issues.  This is not an allergy and we recommend keeping the food in the diet  Intolerance: this is when you have negative testing by either skin testing or blood testing thus not allergic but the food causes symptoms (like belly pain, bloating, diarrhea etc) with ingestion.  These foods should be avoided to prevent symptoms.     Follow-up in 4-6 months or sooner if needed

## 2023-01-04 NOTE — Progress Notes (Signed)
42 y.o. [id number] Married Caucasian female here for annual exam.   Did not take Micronor for irregular menses.  Her period is now on time.  Periods are manageable.   Lost about 15 pounds.  Eating healthier and exercising.  Feeling great.   Some burning with urination and lower abdominal and back pain.  Intercourse painful.  Some discharge.   Wants routine labs.   PCP: Patient, No Pcp Per   Patient's last menstrual period was 12/05/2022.     Period Cycle (Days): 28 Period Duration (Days): 5-7 Period Pattern: Regular Menstrual Flow: Moderate Menstrual Control: Maxi pad, Tampon Dysmenorrhea: (!) Moderate     Sexually active: Yes.    The current method of family planning is vasectomy.    Menopausal hormone therapy:  n/a Exercising: Yes.     Walking, running Smoker:  no  OB History  Gravida Para Term Preterm AB Living  5 2 1 1 3 2   SAB IAB Ectopic Multiple Live Births  3 0 0 0 2    # Outcome Date GA Lbr Len/2nd Weight Sex Type Anes PTL Lv  5 Preterm      CS-Unspec   LIV  4 SAB           3 SAB           2 SAB           1 Term      CS-Unspec   LIV     HEALTH MAINTENANCE: Last 2 paps:  12/13/18 neg: HR HPV neg, 11/11/16 WNL History of abnormal Pap or positive HPV:  no Mammogram:   06/03/22 Breast Density Cat C, BI-RADS CAT 1 neg Colonoscopy:  n/a Bone Density:  n/a  Result  n/a   Immunization History  Administered Date(s) Administered   Influenza,inj,Quad PF,6+ Mos 11/17/2018   Influenza,inj,quad, With Preservative 11/24/2017   Influenza-Unspecified 11/20/2018, 12/04/2020   Tdap 10/24/2012      reports that she has never smoked. She has never used smokeless tobacco. She reports current alcohol use. She reports that she does not use drugs.  Past Medical History:  Diagnosis Date   Allergic rhinitis    Bicornate uterus    Migraine headache with aura     Past Surgical History:  Procedure Laterality Date   CESAREAN SECTION  2009 and 2012   DILATION AND  CURETTAGE OF UTERUS     REFRACTIVE SURGERY      Current Outpatient Medications  Medication Sig Dispense Refill   Calcium Carbonate Antacid (TUMS PO) Take by mouth.     cetirizine (ZYRTEC) 10 MG tablet Take 10 mg by mouth daily.     Multiple Vitamin (MULTIVITAMIN PO) Take by mouth.     No current facility-administered medications for this visit.    ALLERGIES: Latex and Pollen extract  Family History  Problem Relation Age of Onset   Asthma Mother    Allergic rhinitis Mother    Non-Hodgkin's lymphoma Mother    Heart failure Maternal Grandmother    COPD Maternal Grandmother    Hypertension Maternal Grandmother     Review of Systems  All other systems reviewed and are negative.   PHYSICAL EXAM:  BP 126/74 (BP Location: Left Arm, Patient Position: Sitting, Cuff Size: Normal)   Ht 5' 4.25" (1.632 m)   Wt 148 lb (67.1 kg)   LMP 12/05/2022   BMI 25.21 kg/m     General appearance: alert, cooperative and appears stated age Head: normocephalic, without obvious abnormality, atraumatic  Neck: no adenopathy, supple, symmetrical, trachea midline and thyroid normal to inspection and palpation Lungs: clear to auscultation bilaterally Breasts: normal appearance, no masses or tenderness, No nipple retraction or dimpling, No nipple discharge or bleeding, No axillary adenopathy Heart: regular rate and rhythm Abdomen: soft, non-tender; no masses, no organomegaly Extremities: extremities normal, atraumatic, no cyanosis or edema Skin: skin color, texture, turgor normal. No rashes or lesions Lymph nodes: cervical, supraclavicular, and axillary nodes normal. Neurologic: grossly normal  Pelvic: External genitalia:  no lesions              No abnormal inguinal nodes palpated.              Urethra:  normal appearing urethra with no masses, tenderness or lesions              Bartholins and Skenes: normal                 Vagina: normal appearing vagina with normal color and discharge, no lesions               Cervix: no lesions              Pap taken: No. Bimanual Exam:  Uterus:  normal size, contour, position, consistency, mobility, non-tender              Adnexa: no mass, fullness, tenderness              Rectal exam: No.  Declined.   Chaperone was present for exam:  Warren Lacy, CMA  PHQ9:  zero  ASSESSMENT: Well woman visit with gynecologic exam Dysuria, pelvic pain, back pain consistent with UTI.   Vaginal discharge.  Routine labs.   PLAN: Mammogram screening discussed. Self breast awareness reviewed. Pap and HRV collected:  No.  Due in 2025.  Guidelines for Calcium, Vitamin D, regular exercise program including cardiovascular and weight bearing exercise. Medication refills:  NA Labs:  lipids, CMP, CBC  Urinalysis:  sg 1.025, 0 - 5 WBC, NS RBC, mod bacteria, 6 - 10 squams, few mucus. Rx for Bactrim DS po bid x 3 days.  Take OTC AZO.  Vaginitis swab sent.  Follow up:  1 year and prn.

## 2023-01-18 ENCOUNTER — Ambulatory Visit (INDEPENDENT_AMBULATORY_CARE_PROVIDER_SITE_OTHER): Payer: 59 | Admitting: Obstetrics and Gynecology

## 2023-01-18 ENCOUNTER — Other Ambulatory Visit: Payer: Self-pay | Admitting: Obstetrics and Gynecology

## 2023-01-18 ENCOUNTER — Encounter: Payer: Self-pay | Admitting: Obstetrics and Gynecology

## 2023-01-18 ENCOUNTER — Other Ambulatory Visit (HOSPITAL_COMMUNITY)
Admission: RE | Admit: 2023-01-18 | Discharge: 2023-01-18 | Disposition: A | Discharge status: Home / Self Care | Payer: 59 | Source: Ambulatory Visit | Attending: Obstetrics and Gynecology | Admitting: Obstetrics and Gynecology

## 2023-01-18 VITALS — BP 126/74 | Ht 64.25 in | Wt 148.0 lb

## 2023-01-18 DIAGNOSIS — N898 Other specified noninflammatory disorders of vagina: Secondary | ICD-10-CM

## 2023-01-18 DIAGNOSIS — M549 Dorsalgia, unspecified: Secondary | ICD-10-CM

## 2023-01-18 DIAGNOSIS — Z01419 Encounter for gynecological examination (general) (routine) without abnormal findings: Secondary | ICD-10-CM | POA: Diagnosis not present

## 2023-01-18 DIAGNOSIS — Z0000 Encounter for general adult medical examination without abnormal findings: Secondary | ICD-10-CM

## 2023-01-18 DIAGNOSIS — R102 Pelvic and perineal pain: Secondary | ICD-10-CM

## 2023-01-18 DIAGNOSIS — R300 Dysuria: Secondary | ICD-10-CM | POA: Diagnosis not present

## 2023-01-18 MED ORDER — SULFAMETHOXAZOLE-TRIMETHOPRIM 800-160 MG PO TABS: 1.0000 | ORAL_TABLET | Freq: Two times a day (BID) | ORAL | 0 refills | Status: DC

## 2023-01-18 MED ORDER — CIPROFLOXACIN HCL 250 MG PO TABS: 250.0000 mg | ORAL_TABLET | Freq: Two times a day (BID) | ORAL | 0 refills | Status: AC

## 2023-01-18 NOTE — Patient Instructions (Signed)

## 2023-01-18 NOTE — Telephone Encounter (Signed)
Spoke with patient. Patient took one dose of Bactrim DS prescribed today, reports dizziness, facial flushing, hives and itchy rash after taking medication. Takes Alevert daily, took this morning. Denies wheezing, difficulty breathing or SOB.   Instructed patient to discontinue Bactrim. Patient is home. Advised Ok to take OTC Benadryl. Allergy list updated. Bactrim discontinued. Advised I will provide update to Dr. Edward Jolly and f/u with recommendations. Patient agreeable.   Dr. Edward Jolly -please advise.

## 2023-01-18 NOTE — Progress Notes (Signed)
Rx for Ciprofloxacin.

## 2023-01-19 LAB — COMPREHENSIVE METABOLIC PANEL
AG Ratio: 1.9 (calc) (ref 1.0–2.5)
ALT: 11 U/L (ref 6–29)
AST: 11 U/L (ref 10–30)
Albumin: 4.3 g/dL (ref 3.6–5.1)
Alkaline phosphatase (APISO): 46 U/L (ref 31–125)
BUN: 13 mg/dL (ref 7–25)
CO2: 27 mmol/L (ref 20–32)
Calcium: 9.5 mg/dL (ref 8.6–10.2)
Chloride: 102 mmol/L (ref 98–110)
Creat: 0.89 mg/dL (ref 0.50–0.99)
Globulin: 2.3 g/dL (ref 1.9–3.7)
Glucose, Bld: 82 mg/dL (ref 65–99)
Potassium: 4.4 mmol/L (ref 3.5–5.3)
Sodium: 138 mmol/L (ref 135–146)
Total Bilirubin: 0.7 mg/dL (ref 0.2–1.2)
Total Protein: 6.6 g/dL (ref 6.1–8.1)

## 2023-01-19 LAB — LIPID PANEL
Cholesterol: 165 mg/dL (ref <200)
HDL: 53 mg/dL (ref >=50)
LDL Cholesterol (Calc): 91 mg/dL
Non-HDL Cholesterol (Calc): 112 mg/dL (ref <130)
Total CHOL/HDL Ratio: 3.1 (calc) (ref <5.0)
Triglycerides: 117 mg/dL (ref <150)

## 2023-01-19 LAB — CBC
HCT: 43.3 % (ref 35.0–45.0)
Hemoglobin: 13.9 g/dL (ref 11.7–15.5)
MCH: 28.5 pg (ref 27.0–33.0)
MCHC: 32.1 g/dL (ref 32.0–36.0)
MCV: 88.7 fL (ref 80.0–100.0)
MPV: 12.9 fL — ABNORMAL HIGH (ref 7.5–12.5)
Platelets: 223 10*3/uL (ref 140–400)
RBC: 4.88 10*6/uL (ref 3.80–5.10)
RDW: 12.1 % (ref 11.0–15.0)
WBC: 6.1 10*3/uL (ref 3.8–10.8)

## 2023-01-19 LAB — CERVICOVAGINAL ANCILLARY ONLY
Bacterial Vaginitis (gardnerella): NEGATIVE
Candida Glabrata: NEGATIVE
Candida Vaginitis: NEGATIVE
Comment: NEGATIVE
Comment: NEGATIVE
Comment: NEGATIVE
Comment: NEGATIVE
Trichomonas: NEGATIVE

## 2023-01-20 LAB — URINALYSIS, COMPLETE W/RFL CULTURE
Bilirubin Urine: NEGATIVE
Glucose, UA: NEGATIVE
Hgb urine dipstick: NEGATIVE
Hyaline Cast: NONE SEEN /LPF
Leukocyte Esterase: NEGATIVE
Nitrites, Initial: NEGATIVE
Protein, ur: NEGATIVE
RBC / HPF: NONE SEEN /[HPF] (ref 0–2)
Specific Gravity, Urine: 1.025 (ref 1.001–1.035)
pH: 5.5 (ref 5.0–8.0)

## 2023-01-20 LAB — URINE CULTURE
MICRO NUMBER:: 15801999
Result:: NO GROWTH
SPECIMEN QUALITY:: ADEQUATE

## 2023-01-20 LAB — CULTURE INDICATED

## 2023-03-18 ENCOUNTER — Ambulatory Visit: Payer: 59 | Admitting: Podiatry

## 2023-04-26 ENCOUNTER — Encounter: Payer: Self-pay | Admitting: Dermatology

## 2023-04-26 ENCOUNTER — Ambulatory Visit (INDEPENDENT_AMBULATORY_CARE_PROVIDER_SITE_OTHER): Payer: 59 | Admitting: Dermatology

## 2023-04-26 VITALS — BP 120/79

## 2023-04-26 DIAGNOSIS — W908XXA Exposure to other nonionizing radiation, initial encounter: Secondary | ICD-10-CM | POA: Diagnosis not present

## 2023-04-26 DIAGNOSIS — L814 Other melanin hyperpigmentation: Secondary | ICD-10-CM

## 2023-04-26 DIAGNOSIS — L578 Other skin changes due to chronic exposure to nonionizing radiation: Secondary | ICD-10-CM | POA: Diagnosis not present

## 2023-04-26 DIAGNOSIS — D229 Melanocytic nevi, unspecified: Secondary | ICD-10-CM

## 2023-04-26 DIAGNOSIS — Z1283 Encounter for screening for malignant neoplasm of skin: Secondary | ICD-10-CM | POA: Diagnosis not present

## 2023-04-26 DIAGNOSIS — D1801 Hemangioma of skin and subcutaneous tissue: Secondary | ICD-10-CM

## 2023-04-26 NOTE — Patient Instructions (Addendum)
 Hello Leslie Barnes ,  Thank you for visiting [place] today.   Below is a summary of the key instructions from our consultation today:  Sunscreen: Continue using sunscreen diligently, ensuring to reapply every 2 hours when at the beach.  Skin Monitoring: Monthly monitor your skin for any changes, especially considering your family history of moles.  Cleanser: Switch to a hydrating cleanser to prevent further drying of your skin. We have provided samples of La Roche, Mucerin, and CeraVe for you to try.  Night Cream: Use a thick, heavy cream at night to address dryness and patchy exfoliation.  Post-V-beam Care: Given the sensitivity of your skin post-V-beam, avoid foaming face washes and instead opt for the hydrating options you already have.  Moisturization: Try the Lipikar from [facility] for triple moisture and the Cicaplast as a recovery balm for nighttime use.  Face Washing Routine: It's okay to wash your face once a day at night and simply use warm water in the morning.  Please do not hesitate to reach out if you have any questions or concerns regarding the instructions provided. I am looking forward to seeing you in a year for your next check-up.  Warm regards,  Dr. [facility]     Important Information  Due to recent changes in healthcare laws, you may see results of your pathology and/or laboratory studies on MyChart before the doctors have had a chance to review them. We understand that in some cases there may be results that are confusing or concerning to you. Please understand that not all results are received at the same time and often the doctors may need to interpret multiple results in order to provide you with the best plan of care or course of treatment. Therefore, we ask that you please give [place] 2 business days to thoroughly review all your results before contacting the office for clarification. Should we see a critical lab result, you will be contacted  sooner.   If You Need Anything After Your Visit  If you have any questions or concerns for your doctor, please call our main line at [phone] If no one answers, please leave a voicemail as directed and we will return your call as soon as possible. Messages left after 4 pm will be answered the following business day.   You may also send [place] a message via MyChart. We typically respond to MyChart messages within 1-2 business days.  For prescription refills, please ask your pharmacy to contact our office. Our fax number is [phone].  If you have an urgent issue when the clinic is closed that cannot wait until the next business day, you can page your doctor at the number below.    Please note that while we do our best to be available for urgent issues outside of office hours, we are not available 24/7.   If you have an urgent issue and are unable to reach [place], you may choose to seek medical care at your doctor's office, retail clinic, urgent care center, or emergency room.  If you have a medical emergency, please immediately call 911 or go to the emergency department. In the event of inclement weather, please call our main line at [phone] for an update on the status of any delays or closures.  Dermatology Medication Tips: Please keep the boxes that topical medications come in in order to help keep track of the instructions about where and how to use these. Pharmacies typically print the medication instructions only on the  boxes and not directly on the medication tubes.   If your medication is too expensive, please contact our office at [phone] or send [place] a message through MyChart.   We are unable to tell what your co-pay for medications will be in advance as this is different depending on your insurance coverage. However, we may be able to find a substitute medication at lower cost or fill out paperwork to get insurance to cover a needed medication.   If a prior authorization is  required to get your medication covered by your insurance company, please allow [place] 1-2 business days to complete this process.  Drug prices often vary depending on where the prescription is filled and some pharmacies may offer cheaper prices.  The website www.goodrx.com contains coupons for medications through different pharmacies. The prices here do not account for what the cost may be with help from insurance (it may be cheaper with your insurance), but the website can give you the price if you did not use any insurance.  - You can print the associated coupon and take it with your prescription to the pharmacy.  - You may also stop by our office during regular business hours and pick up a GoodRx coupon card.  - If you need your prescription sent electronically to a different pharmacy, notify our office through [facility] or by phone at [phone]

## 2023-04-26 NOTE — Telephone Encounter (Signed)
 I recommend CerVe , LRP and Eucerin.  She can pic up a bag of samples if she wants.

## 2023-04-26 NOTE — Progress Notes (Signed)
   New Patient Visit   Subjective  Leslie Barnes is a 43 y.o. female who presents for the following:  Total Body Skin Exam (TBSE)  Patient present today for new patient visit for TBSE.The patient denies she has spots, moles and lesions to be evaluated, some may be new or changing and the patient may have concern these could be cancer. Patient has previously been treated by a dermatologist.Patient reports she does not have hx of bx. Patient denies family history of skin cancers. Patient reports throughout her lifetime has had moderate sun exposure. Currently, patient reports if she has excessive sun exposure, she does apply sunscreen and/or wears protective coverings.  The following portions of the chart were reviewed this encounter and updated as appropriate: medications, allergies, medical history  Review of Systems:  No other skin or systemic complaints except as noted in HPI or Assessment and Plan.  Objective  Well appearing patient in no apparent distress; mood and affect are within normal limits.  A full examination was performed including scalp, head, eyes, ears, nose, lips, neck, chest, axillae, abdomen, back, buttocks, bilateral upper extremities, bilateral lower extremities, hands, feet, fingers, toes, fingernails, and toenails. All findings within normal limits unless otherwise noted below.     Relevant exam findings are noted in the Assessment and Plan.    Assessment & Plan   LENTIGINES Exam: scattered tan macules Due to sun exposure Treatment Plan: Benign-appearing, observe. Recommend daily broad spectrum sunscreen SPF 30+ to sun-exposed areas, reapply every 2 hours as needed.  Call for any changes  BENIGN MELANOCYTIC NEVI - Tan-brown and/or pink-flesh-colored symmetric macules and papules - Benign appearing on exam today - Observation - Call clinic for new or changing moles - Recommend daily use of broad spectrum spf 30+ sunscreen to sun-exposed areas.   MILD  ACTINIC DAMAGE - Chronic condition, secondary to cumulative UV/sun exposure - diffuse scaly erythematous macules with underlying dyspigmentation - Recommend daily broad spectrum sunscreen SPF 30+ to sun-exposed areas, reapply every 2 hours as needed.  - Staying in the shade or wearing long sleeves, sun glasses (UVA+UVB protection) and wide brim hats (4-inch brim around the entire circumference of the hat) are also recommended for sun protection.  - Call for new or changing lesions.  SKIN CANCER SCREENING PERFORMED TODAY   Return in about 1 year (around 04/25/2024) for TBSE.   Documentation: I have reviewed the above documentation for accuracy and completeness, and I agree with the above.   I, Shirron Marcha Solders, CMA, am acting as scribe for [employer], DO.   Langston Reusing, DO

## 2023-05-09 ENCOUNTER — Other Ambulatory Visit: Payer: Self-pay | Admitting: Obstetrics and Gynecology

## 2023-05-09 DIAGNOSIS — Z1231 Encounter for screening mammogram for malignant neoplasm of breast: Secondary | ICD-10-CM

## 2023-05-24 ENCOUNTER — Other Ambulatory Visit: Payer: Self-pay | Admitting: Obstetrics and Gynecology

## 2023-05-24 DIAGNOSIS — Z1231 Encounter for screening mammogram for malignant neoplasm of breast: Secondary | ICD-10-CM

## 2023-06-03 DIAGNOSIS — Z1231 Encounter for screening mammogram for malignant neoplasm of breast: Secondary | ICD-10-CM

## 2023-06-07 DIAGNOSIS — Z1231 Encounter for screening mammogram for malignant neoplasm of breast: Secondary | ICD-10-CM

## 2023-06-08 ENCOUNTER — Ambulatory Visit
Admission: RE | Admit: 2023-06-08 | Discharge: 2023-06-08 | Disposition: A | Discharge status: Home / Self Care | Source: Ambulatory Visit

## 2023-06-08 DIAGNOSIS — Z1231 Encounter for screening mammogram for malignant neoplasm of breast: Secondary | ICD-10-CM

## 2023-06-16 ENCOUNTER — Ambulatory Visit: Payer: 59 | Admitting: Allergy

## 2023-07-27 ENCOUNTER — Ambulatory Visit (HOSPITAL_BASED_OUTPATIENT_CLINIC_OR_DEPARTMENT_OTHER): Payer: 59 | Admitting: Family Medicine

## 2023-09-25 IMAGING — MG MM DIGITAL SCREENING BILAT W/ TOMO AND CAD
8 series · 8 of 24 positions shown · non-contrast
Comparison: Previous exam(s).

CLINICAL DATA: Screening.

EXAM:
DIGITAL SCREENING BILATERAL MAMMOGRAM WITH TOMOSYNTHESIS AND CAD
TECHNIQUE: Bilateral screening digital craniocaudal and mediolateral oblique
mammograms were obtained. Bilateral screening digital breast
tomosynthesis was performed. The images were evaluated with
computer-aided detection.

[L MLO synth-2D]
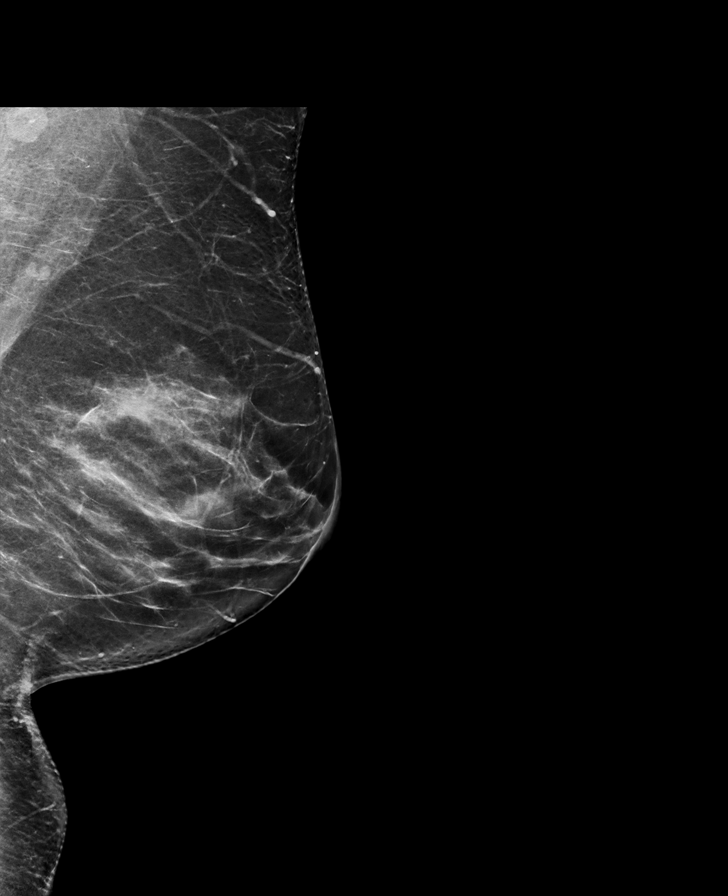

[R MLO synth-2D]
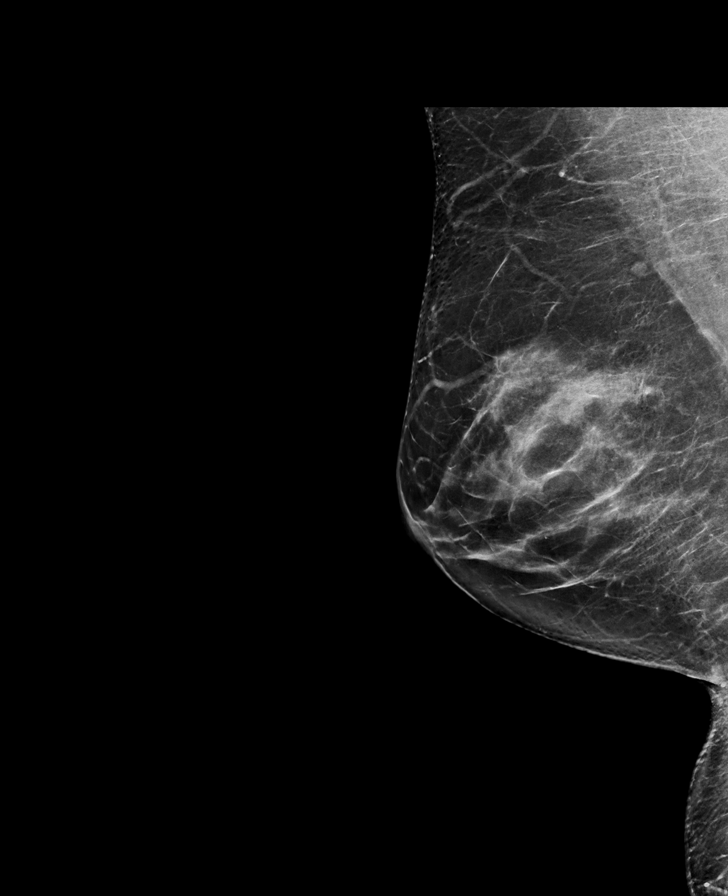

[L CC synth-2D]
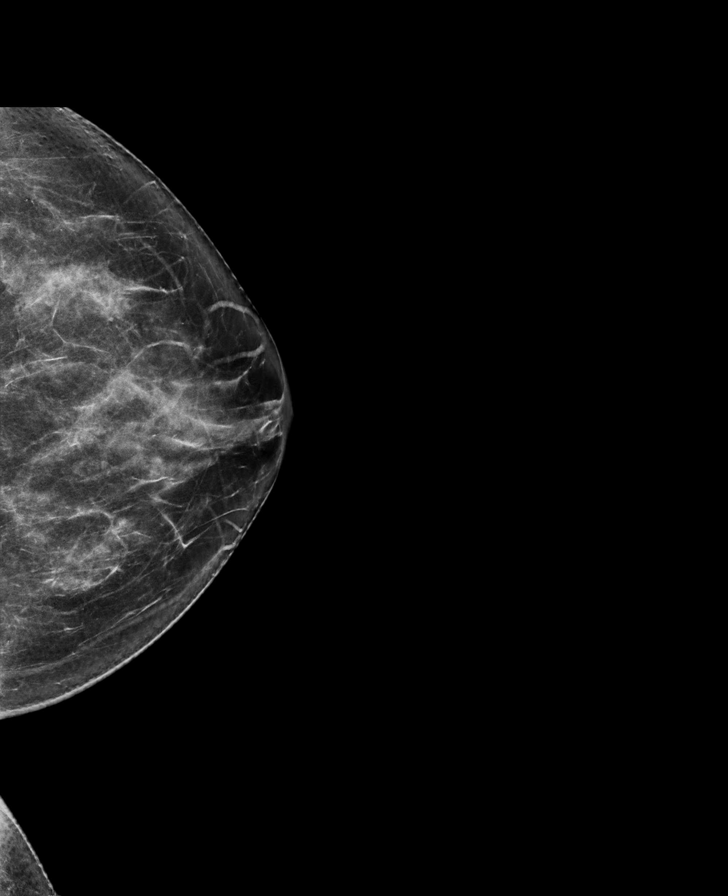

[R CC synth-2D]
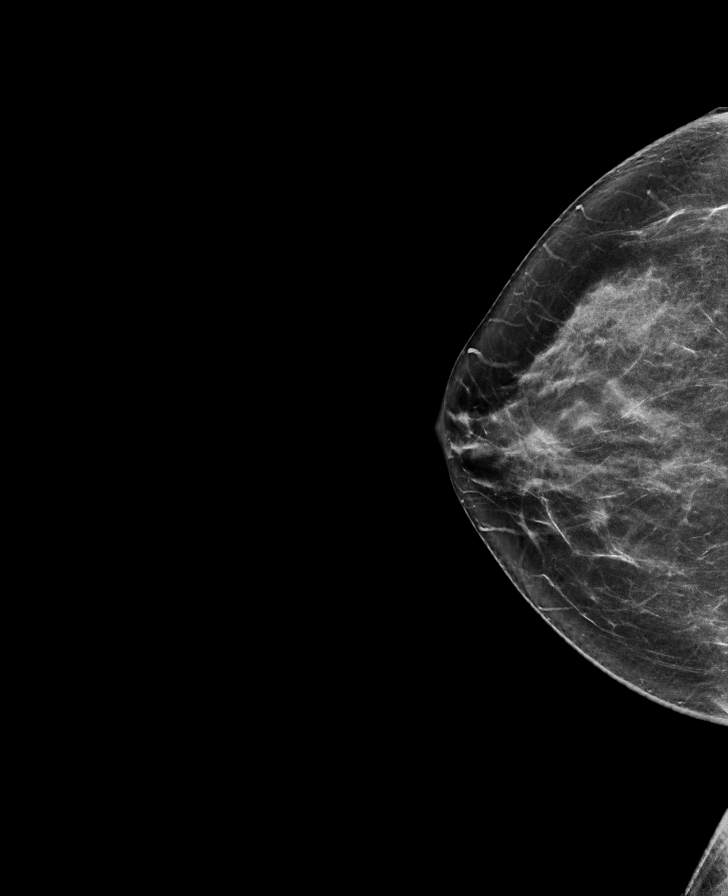

[R MLO tomo · tomo slice 43/84.0]
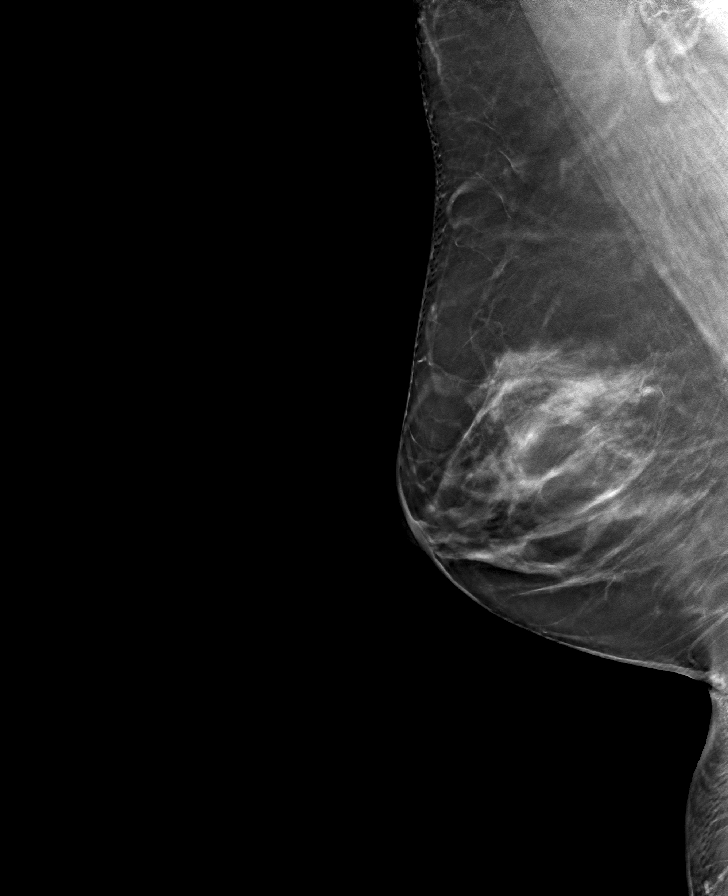

[L CC tomo · tomo slice 37/73.0]
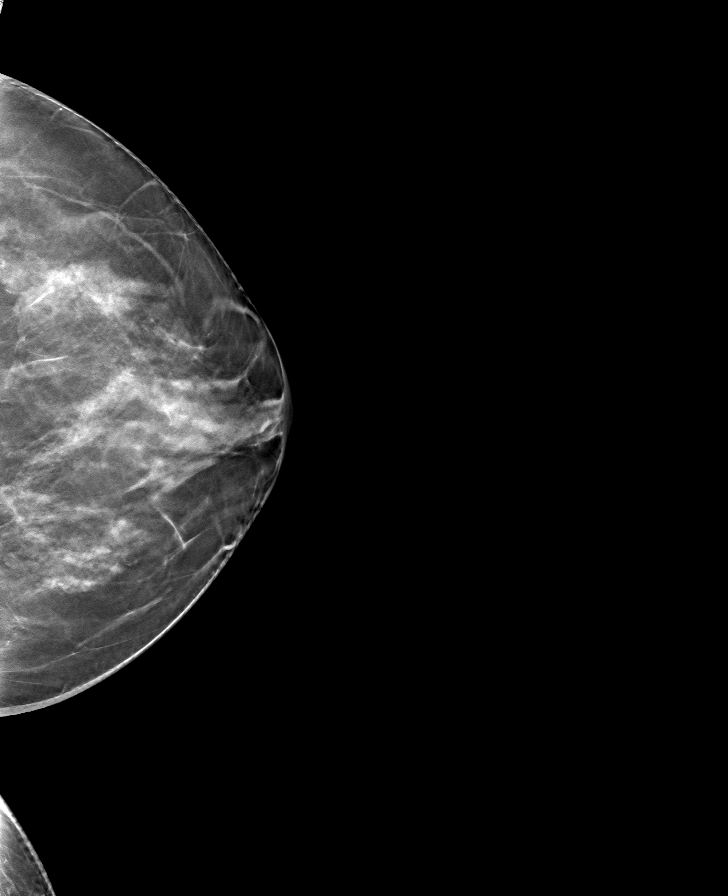

[R CC tomo · tomo slice 37/74.0]
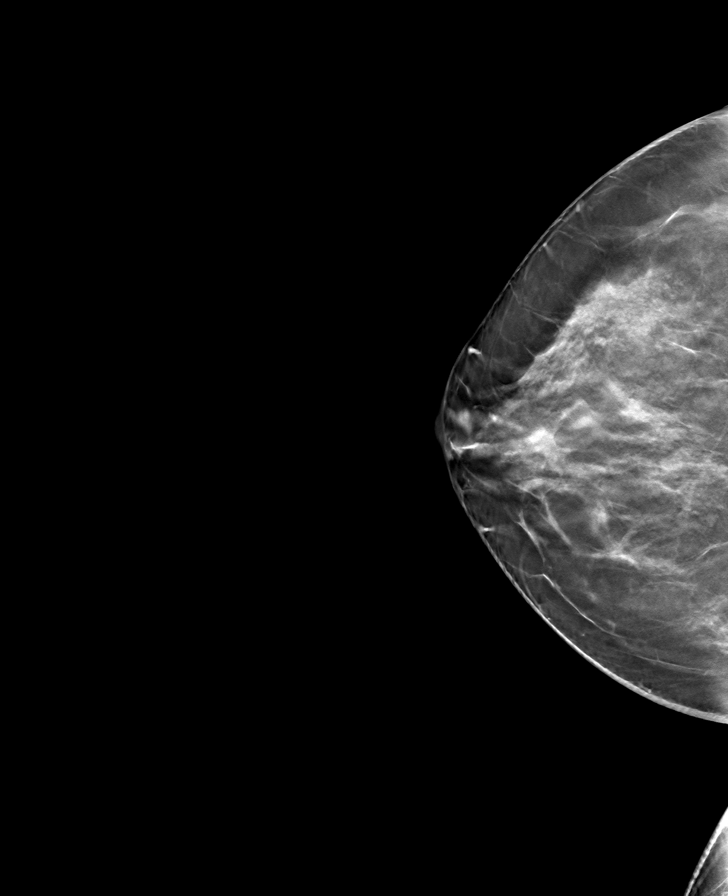

[L MLO tomo · tomo slice 43/85.0]
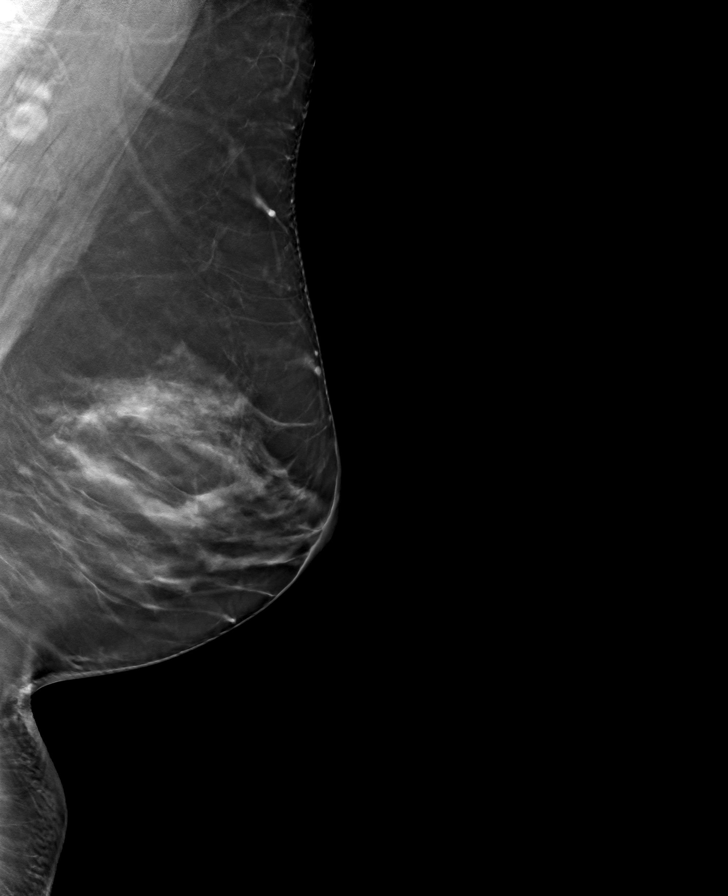

[8 of 24 positions shown; findings below may reference images not displayed]

ACR Breast Density Category c: The breast tissue is heterogeneously
dense, which may obscure small masses.
FINDINGS: There are no findings suspicious for malignancy.
IMPRESSION: No mammographic evidence of malignancy. A result letter of this
screening mammogram will be mailed directly to the patient.

RECOMMENDATION:
Screening mammogram in one year. (Code:Q3-W-BC3)

BI-RADS CATEGORY  1: Negative.

## 2024-01-19 ENCOUNTER — Ambulatory Visit: Payer: 59 | Admitting: Obstetrics and Gynecology
